# Patient Record
Sex: Female | Born: 1975 | Race: White | Hispanic: Yes | Marital: Married | Smoking: Former smoker
Health system: Southern US, Community
[De-identification: ages and names within clinical notes are randomized; demographics above are authoritative.]

## PROBLEM LIST (undated history)

## (undated) ENCOUNTER — Inpatient Hospital Stay (HOSPITAL_COMMUNITY): Payer: Self-pay

## (undated) DIAGNOSIS — R739 Hyperglycemia, unspecified: Secondary | ICD-10-CM

## (undated) DIAGNOSIS — A159 Respiratory tuberculosis unspecified: Secondary | ICD-10-CM

## (undated) DIAGNOSIS — E7800 Pure hypercholesterolemia, unspecified: Secondary | ICD-10-CM

## (undated) DIAGNOSIS — R768 Other specified abnormal immunological findings in serum: Secondary | ICD-10-CM

## (undated) DIAGNOSIS — K219 Gastro-esophageal reflux disease without esophagitis: Secondary | ICD-10-CM

## (undated) HISTORY — DX: Pure hypercholesterolemia, unspecified: E78.00

## (undated) HISTORY — PX: FOOT FRACTURE SURGERY: SHX645

## (undated) HISTORY — DX: Other specified abnormal immunological findings in serum: R76.8

---

## 2000-06-11 ENCOUNTER — Other Ambulatory Visit: Admission: RE | Admit: 2000-06-11 | Discharge: 2000-06-11 | Payer: Self-pay | Admitting: *Deleted

## 2000-09-15 ENCOUNTER — Ambulatory Visit (HOSPITAL_COMMUNITY): Admission: RE | Admit: 2000-09-15 | Discharge: 2000-09-15 | Payer: Self-pay | Admitting: Obstetrics & Gynecology

## 2000-12-17 ENCOUNTER — Ambulatory Visit (HOSPITAL_COMMUNITY): Admission: RE | Admit: 2000-12-17 | Discharge: 2000-12-17 | Payer: Self-pay | Admitting: *Deleted

## 2001-01-27 ENCOUNTER — Inpatient Hospital Stay (HOSPITAL_COMMUNITY): Admission: AD | Admit: 2001-01-27 | Discharge: 2001-01-27 | Payer: Self-pay | Admitting: *Deleted

## 2001-03-17 ENCOUNTER — Encounter (HOSPITAL_COMMUNITY): Admission: RE | Admit: 2001-03-17 | Discharge: 2001-03-19 | Payer: Self-pay | Admitting: Obstetrics & Gynecology

## 2001-03-18 ENCOUNTER — Inpatient Hospital Stay (HOSPITAL_COMMUNITY): Admission: AD | Admit: 2001-03-18 | Discharge: 2001-03-18 | Payer: Self-pay | Admitting: Obstetrics & Gynecology

## 2001-03-19 ENCOUNTER — Inpatient Hospital Stay (HOSPITAL_COMMUNITY): Admission: AD | Admit: 2001-03-19 | Discharge: 2001-03-22 | Payer: Self-pay | Admitting: *Deleted

## 2001-03-23 ENCOUNTER — Inpatient Hospital Stay (HOSPITAL_COMMUNITY): Admission: AD | Admit: 2001-03-23 | Discharge: 2001-03-23 | Payer: Self-pay | Admitting: Obstetrics

## 2002-10-14 ENCOUNTER — Emergency Department (HOSPITAL_COMMUNITY): Admission: EM | Admit: 2002-10-14 | Discharge: 2002-10-15 | Payer: Self-pay | Admitting: Emergency Medicine

## 2003-06-28 ENCOUNTER — Other Ambulatory Visit: Admission: RE | Admit: 2003-06-28 | Discharge: 2003-06-28 | Payer: Self-pay | Admitting: Gynecology

## 2004-01-23 ENCOUNTER — Inpatient Hospital Stay (HOSPITAL_COMMUNITY): Admission: AD | Admit: 2004-01-23 | Discharge: 2004-01-23 | Payer: Self-pay | Admitting: Family Medicine

## 2004-03-28 ENCOUNTER — Ambulatory Visit (HOSPITAL_COMMUNITY): Admission: RE | Admit: 2004-03-28 | Discharge: 2004-03-28 | Payer: Self-pay | Admitting: *Deleted

## 2004-08-22 ENCOUNTER — Ambulatory Visit: Payer: Self-pay | Admitting: *Deleted

## 2004-08-22 ENCOUNTER — Inpatient Hospital Stay (HOSPITAL_COMMUNITY): Admission: RE | Admit: 2004-08-22 | Discharge: 2004-08-22 | Payer: Self-pay | Admitting: *Deleted

## 2004-08-28 ENCOUNTER — Ambulatory Visit: Payer: Self-pay | Admitting: Obstetrics & Gynecology

## 2004-08-28 ENCOUNTER — Inpatient Hospital Stay (HOSPITAL_COMMUNITY): Admission: AD | Admit: 2004-08-28 | Discharge: 2004-08-31 | Payer: Self-pay | Admitting: *Deleted

## 2004-09-04 ENCOUNTER — Inpatient Hospital Stay (HOSPITAL_COMMUNITY): Admission: AD | Admit: 2004-09-04 | Discharge: 2004-09-04 | Payer: Self-pay | Admitting: Obstetrics & Gynecology

## 2004-09-04 ENCOUNTER — Encounter: Admission: RE | Admit: 2004-09-04 | Discharge: 2004-10-04 | Payer: Self-pay | Admitting: Obstetrics & Gynecology

## 2007-03-19 ENCOUNTER — Emergency Department (HOSPITAL_COMMUNITY): Admission: EM | Admit: 2007-03-19 | Discharge: 2007-03-19 | Payer: Self-pay | Admitting: Emergency Medicine

## 2008-02-06 ENCOUNTER — Inpatient Hospital Stay (HOSPITAL_COMMUNITY): Admission: AD | Admit: 2008-02-06 | Discharge: 2008-02-06 | Payer: Self-pay | Admitting: Obstetrics & Gynecology

## 2008-04-11 ENCOUNTER — Ambulatory Visit (HOSPITAL_COMMUNITY): Admission: RE | Admit: 2008-04-11 | Discharge: 2008-04-11 | Payer: Self-pay | Admitting: Obstetrics & Gynecology

## 2008-08-14 ENCOUNTER — Inpatient Hospital Stay (HOSPITAL_COMMUNITY): Admission: AD | Admit: 2008-08-14 | Discharge: 2008-08-19 | Payer: Self-pay | Admitting: Family Medicine

## 2008-08-14 ENCOUNTER — Ambulatory Visit: Payer: Self-pay | Admitting: Obstetrics & Gynecology

## 2008-08-14 ENCOUNTER — Ambulatory Visit: Payer: Self-pay | Admitting: Obstetrics and Gynecology

## 2008-08-15 ENCOUNTER — Encounter: Payer: Self-pay | Admitting: Obstetrics & Gynecology

## 2010-08-06 LAB — CBC
HCT: 26.1 % — ABNORMAL LOW (ref 36.0–46.0)
Hemoglobin: 10 g/dL — ABNORMAL LOW (ref 12.0–15.0)
Hemoglobin: 10.5 g/dL — ABNORMAL LOW (ref 12.0–15.0)
Hemoglobin: 13.4 g/dL (ref 12.0–15.0)
Hemoglobin: 9 g/dL — ABNORMAL LOW (ref 12.0–15.0)
MCHC: 34 g/dL (ref 30.0–36.0)
MCHC: 34.6 g/dL (ref 30.0–36.0)
MCV: 88.4 fL (ref 78.0–100.0)
MCV: 88.5 fL (ref 78.0–100.0)
Platelets: 164 10*3/uL (ref 150–400)
Platelets: 217 10*3/uL (ref 150–400)
RBC: 2.95 MIL/uL — ABNORMAL LOW (ref 3.87–5.11)
RBC: 3.42 MIL/uL — ABNORMAL LOW (ref 3.87–5.11)
RDW: 15.3 % (ref 11.5–15.5)
RDW: 15.6 % — ABNORMAL HIGH (ref 11.5–15.5)
WBC: 13.7 10*3/uL — ABNORMAL HIGH (ref 4.0–10.5)
WBC: 19.9 10*3/uL — ABNORMAL HIGH (ref 4.0–10.5)
WBC: 8 10*3/uL (ref 4.0–10.5)

## 2010-08-06 LAB — CREATININE, SERUM: GFR calc Af Amer: >60 mL/min (ref >60)

## 2010-09-09 NOTE — Op Note (Signed)
Tanya Cruz, Tanya Cruz    ACCOUNT NO.:  [id number]   MEDICAL RECORD NO.:  [id number]          PATIENT TYPE:  INP   LOCATION:  9132                          FACILITY:  [facility]   PHYSICIAN:  Lazaro Arms, M.D.   DATE OF BIRTH:  [birth year]   DATE OF PROCEDURE:  08/15/2008  DATE OF DISCHARGE:                               OPERATIVE REPORT   PREOPERATIVE DIAGNOSES:  1. Intrauterine pregnancy at 40-5/7th weeks gestation.  2. Previous C-section.  3. Vaginal birth attempt after cesarean section, failure.  4. Repetitive variable deceleration leaving a nonreassuring fetal      heart rate tracing.   POSTOPERATIVE DIAGNOSES:  1. Intrauterine pregnancy at 40-5/7th weeks gestation.  2. Previous C-section.  3. Vaginal birth attempt after cesarean section, failure.  4. Repetitive variable deceleration leaving a nonreassuring fetal      heart rate tracing.  5. Uterine hypotonia.   PROCEDURE:  Repeat C-section.   SURGEON:  Lazaro Arms, MD   ANESTHESIA:  Epidural.   FINDINGS:  Low-transverse hysterotomy incision, delivered a viable female  infant with Apgars of 3 and 8 with cord pH of 7.26.  We proceeded with  the C-section because the patient spent several hours despite adequate  labor and the fetal heart rate tracing was starting to worsen going from  severe variables to longer episodes of fetal heart rate depression, as a  result we proceeded with repeat C-section.   DESCRIPTION OF OPERATION:  The patient was taken to the operating room,  placed in supine position.  After her epidural was dosed up, she was  prepped and draped in usual sterile fashion.  A Pfannenstiel skin  incision was made, carried down sharply.  Rectus fascia scored in  midline, extended laterally.  Fascia taken off the muscle superiorly to  inferiorly without difficulty, the muscles were divided.  The peritoneal  cavity was entered and a bladder blade was placed.  Vesicouterine  serosal flap was created.  Low  transverse hysterotomy incision was made  over this incision, delivered a viable female infant, Apgars of 3 and 8,  cord pH 7.26.  Infant underwent routine neonatal resuscitation.  Placenta was delivered spontaneously.  Uterus was hypotonic.  She was  given Methergine, closed two layers; first being a running interlocking  layer; the second being imbricating layer of interrupted figure-of-eight  sutures.  There was good hemostasis of the uterus.  The muscles of  peritoneum reapproximated loosely.  The fascia closed using 0 Vicryl running.  After copious irrigation of  the pelvis and subcu, the skin was closed using skin staples.  The  patient tolerated the procedure well.  She experienced 1000 mL of blood  loss, taken to recovery room in good and stable condition.  All counts  correct x3.      Lazaro Arms, M.D.  Electronically Signed     LHE/MEDQ  D:  08/15/2008  T:  08/16/2008  Job:  [id number]

## 2010-09-09 NOTE — Discharge Summary (Signed)
Tanya Cruz, Tanya Cruz    ACCOUNT NO.:  [id number]   MEDICAL RECORD NO.:  [id number]          PATIENT TYPE:  INP   LOCATION:  9132                          [facility]  [facility]   PHYSICIAN:  Tilda Burrow, M.D. DATE OF BIRTH:  18-Jun-1975   DATE OF ADMISSION:  08/14/2008  DATE OF DISCHARGE:  08/19/2008                               DISCHARGE SUMMARY   REASON FOR ADMISSION:  The patient was admitted for induction of labor  due to post date.   DISCHARGE DIAGNOSIS:  Low transverse cesarean section for nonreassuring  fetal heart rate pattern by Dr. Despina Hidden.   HOSPITAL COURSE:  The patient has had some issues with nausea and  decreased bowel sounds.  The KUB on April 24, showed a totally normal  gas pattern.  The patient is feeling better today and is passing gas  rectally.   PHYSICAL EXAMINATION:  VITAL SIGNS:  Stable.  Hemoglobin is 9,  hematocrit 26, platelets were 217.  HEART:  Regular rhythm and rate.  LUNGS:  Clear to auscultation bilaterally.  ABDOMEN:  Fundus is firm.  Lochia is small amount.  Bowel sounds are  present x4.  Abdomen is soft.  Incision is intact.  There is no redness,  swelling, or drainage.  EXTREMITIES:  Trace edema in lower extremities.   ASSESSMENT:  Stable postop day #4.   DISCHARGE MEDICATIONS:  1. Chromagen Forte one p.o. b.i.d.  2. Continue her prenatal vitamin.  3. Percocet 5/325 one p.o. q.4 h. p.r.n. pain.  4. Motrin 600 q.6 h p.r.n. cramping.   PLAN:  Discharged home.  Baby Love nurse is to come out and remove  staples.  Postop day 5 through 7, she is undecided about her  contraceptive and does not wish to make a decision at this point in  time.  She is to follow up with [facility] in 6  weeks for postpartum checkup.      Zerita Boers, Lanier Clam      Tilda Burrow, M.D.  Electronically Signed    DL/MEDQ  D:  28/41/3244  T:  08/19/2008  Job:  [id number]   cc:   Lazaro Arms, M.D.  Fax: [phone]

## 2011-01-26 LAB — URINALYSIS, ROUTINE W REFLEX MICROSCOPIC
Bilirubin Urine: NEGATIVE
Ketones, ur: 15 — AB
Nitrite: NEGATIVE
Protein, ur: NEGATIVE
Urobilinogen, UA: 0.2

## 2011-01-26 LAB — GC/CHLAMYDIA PROBE AMP, GENITAL
Chlamydia, DNA Probe: NEGATIVE
GC Probe Amp, Genital: NEGATIVE

## 2011-01-26 LAB — WET PREP, GENITAL

## 2011-01-26 LAB — CBC
Hemoglobin: 12.5
Platelets: 266
RDW: 13.9

## 2011-06-02 ENCOUNTER — Ambulatory Visit: Payer: Self-pay | Admitting: Family Medicine

## 2011-06-02 VITALS — BP 114/76 | HR 68 | Temp 98.5°F | Resp 16 | Ht 59.0 in | Wt 160.0 lb

## 2011-06-02 DIAGNOSIS — L259 Unspecified contact dermatitis, unspecified cause: Secondary | ICD-10-CM

## 2011-06-02 MED ORDER — TRIAMCINOLONE ACETONIDE 0.1 % EX CREA: TOPICAL_CREAM | Freq: Two times a day (BID) | CUTANEOUS | Status: DC

## 2011-06-02 MED ORDER — PREDNISONE 20 MG PO TABS: ORAL_TABLET | ORAL | Status: AC

## 2011-06-02 NOTE — Progress Notes (Signed)
  Subjective:    Patient ID: Tanya Cruz, female    DOB: [birth year], 36 y.o.   MRN: [id number]  HPI 36 yo female, healthy, presents with: rash around neck for 4 weeks.  Started with just a few spots and now larger, looks like sunburn.  Itches.  She did wear a new necklace prior to symptoms starting.  Only wore it that once.  Had reacted to earrings before.  Tried "aluminum cream" - no change to rash.  Did help itch.     Review of Systems Negative except as per HPI     Objective:   Physical Exam  Neck:     Obese, alert, normal wob Erythematous maculopapular rash in semicircle around anterior neck and chest.         Assessment & Plan:  Rash - suspect contact dermatitis, likely to metal in new necklace.  TAC cream and pred taper.  RTC if symptoms do not improve.

## 2011-11-25 ENCOUNTER — Other Ambulatory Visit: Payer: Self-pay

## 2011-11-25 ENCOUNTER — Other Ambulatory Visit (HOSPITAL_COMMUNITY): Payer: Self-pay | Admitting: Physician Assistant

## 2011-11-25 DIAGNOSIS — Z0489 Encounter for examination and observation for other specified reasons: Secondary | ICD-10-CM

## 2011-11-25 DIAGNOSIS — IMO0002 Reserved for concepts with insufficient information to code with codable children: Secondary | ICD-10-CM

## 2011-11-25 LAB — OB RESULTS CONSOLE HIV ANTIBODY (ROUTINE TESTING): HIV: NONREACTIVE

## 2011-11-25 LAB — OB RESULTS CONSOLE ABO/RH

## 2011-11-25 LAB — OB RESULTS CONSOLE RUBELLA ANTIBODY, IGM: Rubella: IMMUNE

## 2011-11-25 LAB — OB RESULTS CONSOLE RPR: RPR: NONREACTIVE

## 2011-11-30 ENCOUNTER — Encounter (HOSPITAL_COMMUNITY): Payer: Self-pay

## 2011-11-30 ENCOUNTER — Ambulatory Visit (HOSPITAL_COMMUNITY)
Admission: RE | Admit: 2011-11-30 | Discharge: 2011-11-30 | Disposition: A | Discharge status: Home / Self Care | Payer: Self-pay | Source: Ambulatory Visit | Attending: Physician Assistant | Admitting: Physician Assistant

## 2011-11-30 DIAGNOSIS — O34219 Maternal care for unspecified type scar from previous cesarean delivery: Secondary | ICD-10-CM | POA: Insufficient documentation

## 2011-11-30 DIAGNOSIS — Z0489 Encounter for examination and observation for other specified reasons: Secondary | ICD-10-CM

## 2011-11-30 DIAGNOSIS — IMO0002 Reserved for concepts with insufficient information to code with codable children: Secondary | ICD-10-CM

## 2011-11-30 DIAGNOSIS — O358XX0 Maternal care for other (suspected) fetal abnormality and damage, not applicable or unspecified: Secondary | ICD-10-CM | POA: Insufficient documentation

## 2011-11-30 DIAGNOSIS — Z1389 Encounter for screening for other disorder: Secondary | ICD-10-CM | POA: Insufficient documentation

## 2011-11-30 DIAGNOSIS — Z363 Encounter for antenatal screening for malformations: Secondary | ICD-10-CM | POA: Insufficient documentation

## 2011-12-01 ENCOUNTER — Encounter (HOSPITAL_COMMUNITY): Payer: Self-pay

## 2011-12-01 ENCOUNTER — Ambulatory Visit (HOSPITAL_COMMUNITY)
Admission: RE | Admit: 2011-12-01 | Discharge: 2011-12-01 | Disposition: A | Discharge status: Home / Self Care | Payer: Self-pay | Source: Ambulatory Visit | Attending: Physician Assistant | Admitting: Physician Assistant

## 2011-12-01 NOTE — Progress Notes (Signed)
Genetic Counseling  High-Risk Gestation Note  Appointment Date:  12/01/2011 Referred By: Tanya Gal, PA Date of Birth:  1976/01/16    Pregnancy History: G4P3 Estimated Date of Delivery: 04/10/12 Estimated Gestational Age: [redacted]w[redacted]d Attending: Rema Fendt, MD   Ms. Tanya Cruz, and her partner, Mr. Tanya Cruz, were seen for genetic counseling regarding a maternal age of 36 y.o.Marland Kitchen  They were counseled regarding maternal age and the association with risk for chromosome conditions due to nondisjunction with aging of the ova.   We reviewed chromosomes, nondisjunction, and the associated 1 in 37 risk for fetal aneuploidy related to a maternal age of 36 y.o. at [redacted]w[redacted]d weeks gestation.  They were counseled that the risk for aneuploidy decreases as gestational age increases, accounting for those pregnancies which spontaneously abort.  We specifically discussed Down syndrome (trisomy 13), trisomies 63 and 75, and sex chromosome aneuploidies (47,XXX and 47,XXY) including the common features and prognoses of each.   We also reviewed Ms. Tanya Cruz's maternal serum Quad screen result and the associated reduction in risks for fetal Down syndrome (1 in 175 to 1 in 3,445), trisomy 18 (1 in 526 to less than 1 in 10,000), and ONTDs.  They understand that Quad screening provides a pregnancy specific risk for these conditions, but is not considered to be diagnostic.  They were counseled regarding other available screening and diagnostic options including ultrasound and amniocentesis.  We reviewed that ultrasound performed on 11/30/11 at [facility] [place] reported normal visualized fetal anatomy with no visualized markers of fetal aneuploidy. The risks, benefits, and limitations of each of these options were reviewed in detail.  After thoughtful consideration of these options, they declined additional screening or testing for aneuploidy, including amnicoentesis. She stated that  she did not feel that it would be beneficial to know about the presence of a chromosome condition in the pregnancy prior to delivery.  They understand that screening tests cannot rule out all birth defects or genetic syndromes.  The patient was advised of this limitation and states she still does not want diagnostic testing at this time.  However, they were counseled that 50-80% of fetuses with Down syndrome and up to 90% of fetuses with trisomies 13 and 18, when well visualized, have detectable anomalies or soft markers by ultrasound.   Ms. Tanya Cruz was provided with written information regarding sickle cell anemia (SCA) including the carrier frequency and incidence in the Hispanic population, the availability of carrier testing and prenatal diagnosis if indicated.  In addition, we discussed that hemoglobinopathies are routinely screened for as part of the [place] newborn screening panel.  She declined hemoglobin electrophoresis today.   Both family histories were reviewed and found to be contributory for a niece of the father of the baby with open spina bifida. She had surgical correction after birth, and is currently 36 years old and healthy. She reportedly does not require assistance walking. We discussed that spina bifida is a common birth differences and affects approximately 1 in 1000 live births.  NTDs occur as an isolated finding, in the majority of cases. Isolated NTDs are usually inherited in a multifactorial manner in which there is no prior family history.  Multifactorial conditions have both environmental and genetic factors that contribute to their development.  Both the genetic and environmental factors that contribute to the development of spina bifida are largely unknown; however, some medications and health conditions, such as uncontrolled diabetes and obesity, may increase the chance of spina bifida.  We also discussed that NTDs may occur as a feature of an underlying genetic  syndrome or condition.  Approximately 5-10% of individuals who have spina bifida also have an underlying chromosome condition.  Without additional information, it is difficult to determine a specific etiology.  If the child had a nonsyndromic, multifactorial NTD, the risk of recurrence is estimated to be 0.5-1% for a third degree relative.  If however, the child had an underlying genetic syndrome, the risk of recurrence depends upon the inheritance of the condition.  Further genetic counseling is warranted if more information is obtained. We reviewed MSAFP and ultrasound screening for NTDs.  We reviewed that detailed ultrasound detects between 80 and 90% of NTDs. The family history was otherwise unremarkable for birth defects, mental retardation, recurrent pregnancy loss, or known genetic conditions. Without further information regarding the provided family history, an accurate genetic risk cannot be calculated. Further genetic counseling is warranted if more information is obtained.  Ms. Tanya Cruz denied exposure to environmental toxins or chemical agents. She denied the use of alcohol, tobacco or street drugs. She denied significant viral illnesses during the course of her pregnancy. Her medical and surgical histories were noncontributory.    I counseled this couple regarding the above risks and available options.  The approximate face-to-face time with the genetic counselor was 40 minutes.  Tanya Plowman, MS Certified Genetic Counselor 12/01/2011

## 2012-03-21 ENCOUNTER — Encounter (HOSPITAL_COMMUNITY): Payer: Self-pay | Admitting: Pharmacist

## 2012-04-01 ENCOUNTER — Encounter (HOSPITAL_COMMUNITY): Payer: Self-pay

## 2012-04-01 ENCOUNTER — Encounter (HOSPITAL_COMMUNITY)
Admission: RE | Admit: 2012-04-01 | Discharge: 2012-04-01 | Disposition: A | Discharge status: Home / Self Care | Payer: Medicaid Other | Source: Ambulatory Visit | Attending: Obstetrics & Gynecology | Admitting: Obstetrics & Gynecology

## 2012-04-01 HISTORY — DX: Gastro-esophageal reflux disease without esophagitis: K21.9

## 2012-04-01 LAB — CBC
HCT: 40.4 % (ref 36.0–46.0)
Hemoglobin: 13.6 g/dL (ref 12.0–15.0)
MCV: 86.1 fL (ref 78.0–100.0)
RDW: 15.1 % (ref 11.5–15.5)
WBC: 7.9 10*3/uL (ref 4.0–10.5)

## 2012-04-01 LAB — TYPE AND SCREEN
ABO/RH(D): O POS
Antibody Screen: NEGATIVE

## 2012-04-01 NOTE — Pre-Procedure Instructions (Signed)
Health history received with assistance of hospital Spanish interpreter [facility]

## 2012-04-01 NOTE — Patient Instructions (Addendum)
   Your procedure is scheduled on: Monday December 9th  Enter through the Main Entrance of [facility] at:8am Pick up the phone at the desk and dial [phone] and inform us of your arrival.  Please call this number if you have any problems the morning of surgery: [phone]  Remember: Do not eat or drink anything after midnight on Sunday Please take your pepcid with sips of water morning of surgery with sips of water   Do not wear jewelry, make-up, or FINGER nail polish No metal in your hair or on your body. Do not wear lotions, powders, perfumes. You may wear deodorant.  Please use your CHG wash as directed prior to surgery.  Do not shave anywhere for at least 12 hours prior to first CHG shower.  Do not bring valuables to the hospital.   Leave suitcase in the car. After Surgery it may be brought to your room. For patients being admitted to the hospital, checkout time is 11:00am the day of discharge.

## 2012-04-03 MED ORDER — DEXTROSE 5 % IV SOLN
2.0000 g | INTRAVENOUS | Status: AC
  Administered 2012-04-04: 2 g via INTRAVENOUS
  Filled 2012-04-03: qty 2

## 2012-04-04 ENCOUNTER — Encounter (HOSPITAL_COMMUNITY): Payer: Self-pay | Admitting: *Deleted

## 2012-04-04 ENCOUNTER — Inpatient Hospital Stay (HOSPITAL_COMMUNITY): Payer: Medicaid Other | Admitting: Anesthesiology

## 2012-04-04 ENCOUNTER — Encounter (HOSPITAL_COMMUNITY): Payer: Self-pay | Admitting: Anesthesiology

## 2012-04-04 ENCOUNTER — Inpatient Hospital Stay (HOSPITAL_COMMUNITY)
Admission: AD | Admit: 2012-04-04 | Discharge: 2012-04-04 | Disposition: A | Discharge status: Home / Self Care | Payer: Self-pay | Attending: Obstetrics & Gynecology | Admitting: Obstetrics & Gynecology

## 2012-04-04 ENCOUNTER — Encounter (HOSPITAL_COMMUNITY)
Admission: AD | Disposition: A | Discharge status: Home / Self Care | Payer: Self-pay | Source: Ambulatory Visit | Attending: Obstetrics & Gynecology

## 2012-04-04 ENCOUNTER — Inpatient Hospital Stay (HOSPITAL_COMMUNITY)
Admission: AD | Admit: 2012-04-04 | Discharge: 2012-04-07 | DRG: 766 | Disposition: A | Discharge status: Home / Self Care | Payer: Medicaid Other | Source: Ambulatory Visit | Attending: Obstetrics & Gynecology | Admitting: Obstetrics & Gynecology

## 2012-04-04 DIAGNOSIS — O0930 Supervision of pregnancy with insufficient antenatal care, unspecified trimester: Secondary | ICD-10-CM

## 2012-04-04 DIAGNOSIS — O479 False labor, unspecified: Secondary | ICD-10-CM | POA: Insufficient documentation

## 2012-04-04 DIAGNOSIS — R21 Rash and other nonspecific skin eruption: Secondary | ICD-10-CM

## 2012-04-04 DIAGNOSIS — O34219 Maternal care for unspecified type scar from previous cesarean delivery: Secondary | ICD-10-CM

## 2012-04-04 DIAGNOSIS — O09529 Supervision of elderly multigravida, unspecified trimester: Secondary | ICD-10-CM | POA: Diagnosis present

## 2012-04-04 DIAGNOSIS — IMO0002 Reserved for concepts with insufficient information to code with codable children: Secondary | ICD-10-CM | POA: Diagnosis present

## 2012-04-04 DIAGNOSIS — O9989 Other specified diseases and conditions complicating pregnancy, childbirth and the puerperium: Secondary | ICD-10-CM

## 2012-04-04 DIAGNOSIS — O99893 Other specified diseases and conditions complicating puerperium: Secondary | ICD-10-CM

## 2012-04-04 DIAGNOSIS — Z603 Acculturation difficulty: Secondary | ICD-10-CM

## 2012-04-04 SURGERY — Surgical Case
Anesthesia: Epidural | Site: Abdomen | Wound class: Clean Contaminated

## 2012-04-04 MED ORDER — BUPIVACAINE IN DEXTROSE 0.75-8.25 % IT SOLN
INTRATHECAL | Status: DC | PRN
  Administered 2012-04-04: 1.5 mL via INTRATHECAL

## 2012-04-04 MED ORDER — MEPERIDINE HCL 25 MG/ML IJ SOLN: 6.2500 mg | INTRAMUSCULAR | Status: DC | PRN

## 2012-04-04 MED ORDER — NALOXONE HCL 0.4 MG/ML IJ SOLN: 0.4000 mg | INTRAMUSCULAR | Status: DC | PRN

## 2012-04-04 MED ORDER — KETOROLAC TROMETHAMINE 60 MG/2ML IM SOLN
INTRAMUSCULAR | Status: AC
  Administered 2012-04-04: 60 mg via INTRAMUSCULAR
  Filled 2012-04-04: qty 2

## 2012-04-04 MED ORDER — NALBUPHINE SYRINGE 5 MG/0.5 ML
INJECTION | INTRAMUSCULAR | Status: AC
  Administered 2012-04-04: 10 mg via SUBCUTANEOUS
  Filled 2012-04-04: qty 1

## 2012-04-04 MED ORDER — OXYTOCIN 40 UNITS IN LACTATED RINGERS INFUSION - SIMPLE MED: 62.5000 mL/h | INTRAVENOUS | Status: AC

## 2012-04-04 MED ORDER — IBUPROFEN 600 MG PO TABS
600.0000 mg | ORAL_TABLET | Freq: Four times a day (QID) | ORAL | Status: DC | PRN
  Filled 2012-04-04 (×5): qty 1

## 2012-04-04 MED ORDER — ACETAMINOPHEN 10 MG/ML IV SOLN
1000.0000 mg | Freq: Four times a day (QID) | INTRAVENOUS | Status: AC | PRN
  Administered 2012-04-04: 1000 mg via INTRAVENOUS
  Filled 2012-04-04 (×2): qty 100

## 2012-04-04 MED ORDER — DIPHENHYDRAMINE HCL 50 MG/ML IJ SOLN: 12.5000 mg | INTRAMUSCULAR | Status: DC | PRN

## 2012-04-04 MED ORDER — NALBUPHINE HCL 10 MG/ML IJ SOLN
5.0000 mg | INTRAMUSCULAR | Status: DC | PRN
  Administered 2012-04-04: 10 mg via SUBCUTANEOUS
  Filled 2012-04-04: qty 1

## 2012-04-04 MED ORDER — SCOPOLAMINE 1 MG/3DAYS TD PT72
1.0000 | MEDICATED_PATCH | Freq: Once | TRANSDERMAL | Status: DC
  Filled 2012-04-04: qty 1

## 2012-04-04 MED ORDER — SENNOSIDES-DOCUSATE SODIUM 8.6-50 MG PO TABS
2.0000 | ORAL_TABLET | Freq: Every day | ORAL | Status: DC
  Administered 2012-04-04 – 2012-04-06 (×3): 2 via ORAL

## 2012-04-04 MED ORDER — MIDAZOLAM HCL 2 MG/2ML IJ SOLN: 0.5000 mg | Freq: Once | INTRAMUSCULAR | Status: AC | PRN

## 2012-04-04 MED ORDER — SODIUM CHLORIDE 0.9 % IJ SOLN: 3.0000 mL | Freq: Two times a day (BID) | INTRAMUSCULAR | Status: DC

## 2012-04-04 MED ORDER — ONDANSETRON HCL 4 MG PO TABS: 4.0000 mg | ORAL_TABLET | ORAL | Status: DC | PRN

## 2012-04-04 MED ORDER — DIBUCAINE 1 % RE OINT: 1.0000 "application " | TOPICAL_OINTMENT | RECTAL | Status: DC | PRN

## 2012-04-04 MED ORDER — KETOROLAC TROMETHAMINE 30 MG/ML IJ SOLN
30.0000 mg | Freq: Four times a day (QID) | INTRAMUSCULAR | Status: AC | PRN
  Administered 2012-04-04: 30 mg via INTRAVENOUS
  Filled 2012-04-04: qty 1

## 2012-04-04 MED ORDER — NALOXONE HCL 1 MG/ML IJ SOLN
1.0000 ug/kg/h | INTRAVENOUS | Status: DC | PRN
  Filled 2012-04-04: qty 2

## 2012-04-04 MED ORDER — CEFAZOLIN SODIUM-DEXTROSE 2-3 GM-% IV SOLR
INTRAVENOUS | Status: AC
  Filled 2012-04-04: qty 50

## 2012-04-04 MED ORDER — OXYCODONE-ACETAMINOPHEN 5-325 MG PO TABS
1.0000 | ORAL_TABLET | ORAL | Status: DC | PRN
  Administered 2012-04-05: 1 via ORAL
  Administered 2012-04-05 (×2): 2 via ORAL
  Administered 2012-04-05: 1 via ORAL
  Administered 2012-04-05: 2 via ORAL
  Administered 2012-04-05: 1 via ORAL
  Administered 2012-04-06: 2 via ORAL
  Filled 2012-04-04 (×2): qty 2
  Filled 2012-04-04 (×3): qty 1
  Filled 2012-04-04 (×2): qty 2

## 2012-04-04 MED ORDER — SODIUM CHLORIDE 0.9 % IV SOLN
250.0000 mL | INTRAVENOUS | Status: DC
  Administered 2012-04-04: 1000 mL via INTRAVENOUS

## 2012-04-04 MED ORDER — DIPHENHYDRAMINE HCL 25 MG PO CAPS
25.0000 mg | ORAL_CAPSULE | ORAL | Status: DC | PRN
  Administered 2012-04-05: 25 mg via ORAL
  Filled 2012-04-04: qty 1

## 2012-04-04 MED ORDER — LACTATED RINGERS IV SOLN
INTRAVENOUS | Status: DC
  Administered 2012-04-04 (×3): via INTRAVENOUS

## 2012-04-04 MED ORDER — MAGNESIUM HYDROXIDE 400 MG/5ML PO SUSP: 30.0000 mL | ORAL | Status: DC | PRN

## 2012-04-04 MED ORDER — NALBUPHINE HCL 10 MG/ML IJ SOLN
5.0000 mg | INTRAMUSCULAR | Status: DC | PRN
  Administered 2012-04-04: 10 mg via INTRAVENOUS
  Filled 2012-04-04: qty 1

## 2012-04-04 MED ORDER — WITCH HAZEL-GLYCERIN EX PADS: 1.0000 "application " | MEDICATED_PAD | CUTANEOUS | Status: DC | PRN

## 2012-04-04 MED ORDER — DIPHENHYDRAMINE HCL 25 MG PO CAPS: 25.0000 mg | ORAL_CAPSULE | Freq: Four times a day (QID) | ORAL | Status: DC | PRN

## 2012-04-04 MED ORDER — PROMETHAZINE HCL 25 MG/ML IJ SOLN: 6.2500 mg | INTRAMUSCULAR | Status: DC | PRN

## 2012-04-04 MED ORDER — MENTHOL 3 MG MT LOZG
1.0000 | LOZENGE | OROMUCOSAL | Status: DC | PRN
  Administered 2012-04-04 – 2012-04-06 (×2): 3 mg via ORAL
  Filled 2012-04-04 (×2): qty 9

## 2012-04-04 MED ORDER — PHENYLEPHRINE HCL 10 MG/ML IJ SOLN
INTRAMUSCULAR | Status: DC | PRN
  Administered 2012-04-04: 80 ug via INTRAVENOUS
  Administered 2012-04-04: 120 ug via INTRAVENOUS
  Administered 2012-04-04 (×2): 80 ug via INTRAVENOUS
  Administered 2012-04-04: 40 ug via INTRAVENOUS
  Administered 2012-04-04: 80 ug via INTRAVENOUS
  Administered 2012-04-04: 40 ug via INTRAVENOUS
  Administered 2012-04-04 (×3): 80 ug via INTRAVENOUS
  Administered 2012-04-04: 40 ug via INTRAVENOUS
  Administered 2012-04-04: 80 ug via INTRAVENOUS

## 2012-04-04 MED ORDER — IBUPROFEN 600 MG PO TABS
600.0000 mg | ORAL_TABLET | Freq: Four times a day (QID) | ORAL | Status: DC
  Administered 2012-04-04 – 2012-04-07 (×11): 600 mg via ORAL
  Filled 2012-04-04 (×6): qty 1

## 2012-04-04 MED ORDER — HYDROMORPHONE HCL PF 1 MG/ML IJ SOLN: 0.2500 mg | INTRAMUSCULAR | Status: DC | PRN

## 2012-04-04 MED ORDER — NALBUPHINE HCL 10 MG/ML IJ SOLN
5.0000 mg | INTRAMUSCULAR | Status: DC | PRN
  Filled 2012-04-04: qty 1

## 2012-04-04 MED ORDER — MORPHINE SULFATE (PF) 0.5 MG/ML IJ SOLN
INTRAMUSCULAR | Status: DC | PRN
  Administered 2012-04-04: .1 mg via INTRATHECAL

## 2012-04-04 MED ORDER — ONDANSETRON HCL 4 MG/2ML IJ SOLN
INTRAMUSCULAR | Status: DC | PRN
  Administered 2012-04-04: 4 mg via INTRAVENOUS

## 2012-04-04 MED ORDER — PRENATAL MULTIVITAMIN CH
1.0000 | ORAL_TABLET | Freq: Every day | ORAL | Status: DC
  Administered 2012-04-05 – 2012-04-07 (×3): 1 via ORAL
  Filled 2012-04-04 (×3): qty 1

## 2012-04-04 MED ORDER — ONDANSETRON HCL 4 MG/2ML IJ SOLN: 4.0000 mg | INTRAMUSCULAR | Status: DC | PRN

## 2012-04-04 MED ORDER — FENTANYL CITRATE 0.05 MG/ML IJ SOLN
INTRAMUSCULAR | Status: DC | PRN
  Administered 2012-04-04: 25 ug via INTRATHECAL

## 2012-04-04 MED ORDER — NALBUPHINE HCL 10 MG/ML IJ SOLN
5.0000 mg | INTRAMUSCULAR | Status: DC | PRN
  Filled 2012-04-04 (×2): qty 1

## 2012-04-04 MED ORDER — NALOXONE HCL 1 MG/ML IJ SOLN: 1.0000 ug/kg/h | INTRAVENOUS | Status: DC | PRN

## 2012-04-04 MED ORDER — KETOROLAC TROMETHAMINE 30 MG/ML IJ SOLN: 30.0000 mg | Freq: Four times a day (QID) | INTRAMUSCULAR | Status: AC | PRN

## 2012-04-04 MED ORDER — SODIUM CHLORIDE 0.9 % IJ SOLN: 3.0000 mL | INTRAMUSCULAR | Status: DC | PRN

## 2012-04-04 MED ORDER — KETOROLAC TROMETHAMINE 60 MG/2ML IM SOLN
60.0000 mg | Freq: Once | INTRAMUSCULAR | Status: AC | PRN
  Administered 2012-04-04: 60 mg via INTRAMUSCULAR

## 2012-04-04 MED ORDER — EPHEDRINE SULFATE 50 MG/ML IJ SOLN
INTRAMUSCULAR | Status: DC | PRN
  Administered 2012-04-04: 10 mg via INTRAVENOUS
  Administered 2012-04-04: 5 mg via INTRAVENOUS

## 2012-04-04 MED ORDER — FENTANYL CITRATE 0.05 MG/ML IJ SOLN: 25.0000 ug | INTRAMUSCULAR | Status: DC | PRN

## 2012-04-04 MED ORDER — SCOPOLAMINE 1 MG/3DAYS TD PT72
1.0000 | MEDICATED_PATCH | Freq: Once | TRANSDERMAL | Status: DC
  Administered 2012-04-04: 1.5 mg via TRANSDERMAL

## 2012-04-04 MED ORDER — ZOLPIDEM TARTRATE 5 MG PO TABS: 5.0000 mg | ORAL_TABLET | Freq: Every evening | ORAL | Status: DC | PRN

## 2012-04-04 MED ORDER — TETANUS-DIPHTH-ACELL PERTUSSIS 5-2.5-18.5 LF-MCG/0.5 IM SUSP
0.5000 mL | Freq: Once | INTRAMUSCULAR | Status: AC
  Administered 2012-04-05: 0.5 mL via INTRAMUSCULAR
  Filled 2012-04-04: qty 0.5

## 2012-04-04 MED ORDER — OXYTOCIN 10 UNIT/ML IJ SOLN
40.0000 [IU] | INTRAVENOUS | Status: DC | PRN
  Administered 2012-04-04: 40 [IU] via INTRAVENOUS

## 2012-04-04 MED ORDER — KETOROLAC TROMETHAMINE 30 MG/ML IJ SOLN: 30.0000 mg | Freq: Four times a day (QID) | INTRAMUSCULAR | Status: DC | PRN

## 2012-04-04 MED ORDER — ONDANSETRON HCL 4 MG/2ML IJ SOLN
INTRAMUSCULAR | Status: AC
  Filled 2012-04-04: qty 2

## 2012-04-04 MED ORDER — BUPIVACAINE HCL (PF) 0.5 % IJ SOLN
INTRAMUSCULAR | Status: AC
  Filled 2012-04-04: qty 30

## 2012-04-04 MED ORDER — BUPIVACAINE HCL (PF) 0.5 % IJ SOLN
INTRAMUSCULAR | Status: DC | PRN
  Administered 2012-04-04: 30 mL

## 2012-04-04 MED ORDER — ONDANSETRON HCL 4 MG/2ML IJ SOLN: 4.0000 mg | Freq: Three times a day (TID) | INTRAMUSCULAR | Status: DC | PRN

## 2012-04-04 MED ORDER — METOCLOPRAMIDE HCL 5 MG/ML IJ SOLN: 10.0000 mg | Freq: Three times a day (TID) | INTRAMUSCULAR | Status: DC | PRN

## 2012-04-04 MED ORDER — DIPHENHYDRAMINE HCL 25 MG PO CAPS: 25.0000 mg | ORAL_CAPSULE | ORAL | Status: DC | PRN

## 2012-04-04 MED ORDER — SIMETHICONE 80 MG PO CHEW
80.0000 mg | CHEWABLE_TABLET | ORAL | Status: DC | PRN
  Administered 2012-04-04: 80 mg via ORAL

## 2012-04-04 MED ORDER — GLYCOPYRROLATE 0.2 MG/ML IJ SOLN
INTRAMUSCULAR | Status: AC
  Filled 2012-04-04: qty 4

## 2012-04-04 MED ORDER — PHENYLEPHRINE 40 MCG/ML (10ML) SYRINGE FOR IV PUSH (FOR BLOOD PRESSURE SUPPORT)
PREFILLED_SYRINGE | INTRAVENOUS | Status: AC
  Filled 2012-04-04: qty 10

## 2012-04-04 MED ORDER — LACTATED RINGERS IV SOLN
INTRAVENOUS | Status: DC | PRN
  Administered 2012-04-04: 10:00:00 via INTRAVENOUS

## 2012-04-04 MED ORDER — LANOLIN HYDROUS EX OINT: 1.0000 "application " | TOPICAL_OINTMENT | CUTANEOUS | Status: DC | PRN

## 2012-04-04 MED ORDER — DIPHENHYDRAMINE HCL 50 MG/ML IJ SOLN: 25.0000 mg | INTRAMUSCULAR | Status: DC | PRN

## 2012-04-04 MED ORDER — FENTANYL CITRATE 0.05 MG/ML IJ SOLN
INTRAMUSCULAR | Status: AC
  Filled 2012-04-04: qty 2

## 2012-04-04 MED ORDER — MORPHINE SULFATE 0.5 MG/ML IJ SOLN
INTRAMUSCULAR | Status: AC
  Filled 2012-04-04: qty 10

## 2012-04-04 SURGICAL SUPPLY — 39 items
APL SKNCLS STERI-STRIP NONHPOA (GAUZE/BANDAGES/DRESSINGS) ×1
BENZOIN TINCTURE PRP APPL 2/3 (GAUZE/BANDAGES/DRESSINGS) ×1 IMPLANT
CLOTH BEACON ORANGE TIMEOUT ST (SAFETY) ×2 IMPLANT
DRESSING TELFA 8X3 (GAUZE/BANDAGES/DRESSINGS) ×2 IMPLANT
DRSG OPSITE 6X11 MED (GAUZE/BANDAGES/DRESSINGS) ×1 IMPLANT
DRSG OPSITE POSTOP 4X10 (GAUZE/BANDAGES/DRESSINGS) IMPLANT
DURAPREP 26ML APPLICATOR (WOUND CARE) ×2 IMPLANT
ELECT REM PT RETURN 9FT ADLT (ELECTROSURGICAL) ×2
ELECTRODE REM PT RTRN 9FT ADLT (ELECTROSURGICAL) ×1 IMPLANT
EXTRACTOR VACUUM M CUP 4 TUBE (SUCTIONS) IMPLANT
GAUZE SPONGE 4X4 12PLY STRL LF (GAUZE/BANDAGES/DRESSINGS) ×4 IMPLANT
GLOVE BIO SURGEON STRL SZ7 (GLOVE) ×2 IMPLANT
GLOVE BIOGEL PI IND STRL 7.0 (GLOVE) ×2 IMPLANT
GLOVE BIOGEL PI INDICATOR 7.0 (GLOVE) ×2
GOWN PREVENTION PLUS LG XLONG (DISPOSABLE) ×6 IMPLANT
HEMOSTAT SURGICEL 4X8 (HEMOSTASIS) ×2 IMPLANT
KIT ABG SYR 3ML LUER SLIP (SYRINGE) IMPLANT
NEEDLE HYPO 22GX1.5 SAFETY (NEEDLE) ×2 IMPLANT
NEEDLE HYPO 25X5/8 SAFETYGLIDE (NEEDLE) ×2 IMPLANT
NS IRRIG 1000ML POUR BTL (IV SOLUTION) ×2 IMPLANT
PACK C SECTION WH (CUSTOM PROCEDURE TRAY) ×2 IMPLANT
PAD ABD 7.5X8 STRL (GAUZE/BANDAGES/DRESSINGS) IMPLANT
PAD OB MATERNITY 4.3X12.25 (PERSONAL CARE ITEMS) IMPLANT
RTRCTR C-SECT PINK 25CM LRG (MISCELLANEOUS) ×1 IMPLANT
SLEEVE SCD COMPRESS KNEE LRG (MISCELLANEOUS) IMPLANT
SLEEVE SCD COMPRESS KNEE MED (MISCELLANEOUS) IMPLANT
STAPLER VISISTAT 35W (STAPLE) IMPLANT
STRIP CLOSURE SKIN 1/2X4 (GAUZE/BANDAGES/DRESSINGS) ×1 IMPLANT
SUT PDS AB 0 CT1 27 (SUTURE) ×4 IMPLANT
SUT PDS AB 0 CTX 36 PDP370T (SUTURE) IMPLANT
SUT PLAIN 2 0 XLH (SUTURE) ×2 IMPLANT
SUT VIC AB 0 CT1 36 (SUTURE) ×4 IMPLANT
SUT VIC AB 0 CTX 36 (SUTURE) ×4
SUT VIC AB 0 CTX36XBRD ANBCTRL (SUTURE) ×2 IMPLANT
SUT VIC AB 4-0 KS 27 (SUTURE) ×2 IMPLANT
SYR CONTROL 10ML LL (SYRINGE) ×2 IMPLANT
TOWEL OR 17X24 6PK STRL BLUE (TOWEL DISPOSABLE) ×4 IMPLANT
TRAY FOLEY CATH 14FR (SET/KITS/TRAYS/PACK) ×2 IMPLANT
WATER STERILE IRR 1000ML POUR (IV SOLUTION) ×2 IMPLANT

## 2012-04-04 NOTE — Addendum Note (Signed)
Addendum  created 04/04/12 1629 by Gessica Jawad M Dimple Bastyr, RN   Modules edited:Notes Section    

## 2012-04-04 NOTE — Anesthesia Preprocedure Evaluation (Signed)
Anesthesia Evaluation  Patient identified by MRN, date of birth, ID band Patient awake    Reviewed: Allergy & Precautions, H&P , Patient's Chart, lab work & pertinent test results  Airway Mallampati: II TM Distance: >3 FB Neck ROM: full    Dental No notable dental hx.    Pulmonary neg pulmonary ROS,  breath sounds clear to auscultation  Pulmonary exam normal       Cardiovascular negative cardio ROS  Rhythm:regular Rate:Normal     Neuro/Psych negative neurological ROS  negative psych ROS   GI/Hepatic negative GI ROS, Neg liver ROS, GERD-  ,  Endo/Other  negative endocrine ROS  Renal/GU negative Renal ROS     Musculoskeletal   Abdominal   Peds  Hematology negative hematology ROS (+)   Anesthesia Other Findings   Reproductive/Obstetrics (+) Pregnancy                           Anesthesia Physical Anesthesia Plan  ASA: II  Anesthesia Plan: Epidural   Post-op Pain Management:    Induction:   Airway Management Planned:   Additional Equipment:   Intra-op Plan:   Post-operative Plan:   Informed Consent: I have reviewed the patients History and Physical, chart, labs and discussed the procedure including the risks, benefits and alternatives for the proposed anesthesia with the patient or authorized representative who has indicated his/her understanding and acceptance.     Plan Discussed with:   Anesthesia Plan Comments:         Anesthesia Quick Evaluation  

## 2012-04-04 NOTE — Transfer of Care (Signed)
Immediate Anesthesia Transfer of Care Note  Patient: Tanya Cruz  Procedure(s) Performed: Procedure(s) (LRB) with comments: CESAREAN SECTION (N/A)  Patient Location: PACU  Anesthesia Type:Spinal  Level of Consciousness: awake, alert  and oriented  Airway & Oxygen Therapy: Patient Spontanous Breathing  Post-op Assessment: Report given to PACU RN  Post vital signs: Reviewed and stable  Complications: No apparent anesthesia complications

## 2012-04-04 NOTE — Op Note (Signed)
[place] [facility] PROCEDURE DATE: 04/04/2012  PREOPERATIVE DIAGNOSIS: Intrauterine pregnancy at  [redacted]w[redacted]d weeks gestation; previous cesarean section x 2  POSTOPERATIVE DIAGNOSIS: The same  PROCEDURE: Repeat Low Transverse Cesarean Section  SURGEON:  Dr. Jaynie Collins  ASSISTANT:  Dr. Napoleon Form  ANESTHESIOLOGIST: Dr. Brayton Caves  INDICATIONS: Tanya Cruz is a 36 y.o. G4P1003 at [redacted]w[redacted]d here for repeat cesarean section secondary to the indications listed under preoperative diagnosis; please see preoperative note for further details.  The risks of cesarean section were discussed with the patient including but were not limited to: bleeding which may require transfusion or reoperation; infection which may require antibiotics; injury to bowel, bladder, ureters or other surrounding organs; injury to the fetus; need for additional procedures including hysterectomy in the event of a life-threatening hemorrhage; placental abnormalities wth subsequent pregnancies, incisional problems, thromboembolic phenomenon and other postoperative/anesthesia complications.   The patient concurred with the proposed plan, giving informed written consent for the procedure.    FINDINGS:  Viable female infant in cephalic presentation.  Apgars 8 and 9.  Light meconium stained amniotic fluid.  Intact placenta, three vessel cord.  Normal uterus, fallopian tubes and ovaries bilaterally.  ANESTHESIA: Spinal INTRAVENOUS FLUIDS: 3000 ml ESTIMATED BLOOD LOSS: 800 ml URINE OUTPUT:  75 ml SPECIMENS: Placenta sent to L&D COMPLICATIONS: None immediate  PROCEDURE IN DETAIL:  The patient preoperatively received intravenous antibiotics and had sequential compression devices applied to her lower extremities.  She was then taken to the operating room where spinal anesthesia was administered and was found to be adequate. She was then placed in a dorsal supine position with a leftward tilt, and prepped and draped in a  sterile manner.  A foley catheter was placed into her bladder and attached to constant gravity.  After an adequate timeout was performed, a Pfannenstiel skin incision was made with scalpel over her preexisting scar, and carried through to the underlying layer of fascia.  The fascia was noted to be very tough due to the previous cesarean sections. The fascia was incised in the midline, and this incision was extended bilaterally using the Mayo scissors.  Kocher clamps were applied to the superior aspect of the fascial incision and the underlying rectus muscles were dissected off bluntly and sharply. A similar process was carried out on the inferior aspect of the fascial incision. The rectus muscles were separated in the midline sharply and the peritoneum was entered bluntly. Incisions were made horizontally about 1.5 cm on the rectus muscles bilaterally to obtain good exposure.  Minimal adhesive disease was noted around the uterus Attention was turned to the lower uterine segment where a low transverse hysterotomy was made with a scalpel and extended bilaterally bluntly.  The infant was successfully delivered, the cord was clamped and cut and the infant was handed over to awaiting neonatology team. Uterine massage was then administered, and the placenta delivered intact with a three-vessel cord. The uterus was then cleared of clot and debris.  The hysterotomy was closed with 0 Vicryl in a running locked fashion, and an imbricating layer was also placed with a 0 Vicryl suture.  Further hemostasis was obtained with a few figure of eight stitches on the incision. The pelvis was cleared of all clot and debris. Hemostasis was confirmed on all surfaces.  The fascia was then closed using 0 PDS in a running fashion.  The subcutaneous layer was irrigated, then reapproximated with 2-0 plain gut interrupted stitches, and the skin was closed with a 4-0 Vicryl subcuticular stitch.  30 ml of 0.5% Marcaine was injected subcutaneously  around the incision. The patient tolerated the procedure well. Sponge, lap, instrument and needle counts were correct x 2.  She was taken to the recovery room in stable condition.

## 2012-04-04 NOTE — H&P (Signed)
Obstetric History and Physical  Tanya Cruz is a 36 y.o. G4P3 with IUP at [redacted]w[redacted]d presenting for scheduled repeat cesarean section, two previous cesarean sections.   Prenatal Course Source of Care: [facility] starting at 20 weeks Pregnancy complications or risks: Patient Active Problem List  Diagnosis  . Previous cesarean section complicating pregnancy, antepartum condition or complication  . Language barrier, cultural differences  . Advanced maternal age, antepartum  . Late prenatal care  She plans to breastfeed, plans to bottle feed She desires condoms for postpartum contraception.   Prenatal labs and studies: ABO, Rh: --/--/O POS (12/06 0950) Antibody: NEG (12/06 0950) Rubella: Immune (07/31 0000) RPR: NON REACTIVE (12/06 0950)  HBsAg: Negative (07/31 0000)  HIV: Non-reactive (07/31 0000)  GBS: Negative 1 hr Glucola  119 (early); then 110 Genetic screening normal Anatomy US normal  Prenatal Transfer Tool  Maternal Diabetes: No Genetic Screening: Normal Maternal Ultrasounds/Referrals: Normal Fetal Ultrasounds or other Referrals:  None Maternal Substance Abuse:  No Significant Maternal Medications:  None Significant Maternal Lab Results: Lab values include: Group B Strep negative  Past Medical History  Diagnosis Date  . GERD (gastroesophageal reflux disease)     Past Surgical History  Procedure Date  . Cesarean section     x2  . Foot fracture surgery     OB History    Grav Para Term Preterm Abortions TAB SAB Ect Mult Living   4 3        3     Patient denies any recent cervical dysplasia or STIs.    History   Social History  . Marital Status: Married    Spouse Name: N/A    Number of Children: N/A  . Years of Education: N/A   Social History Main Topics  . Smoking status: Never Smoker   . Smokeless tobacco: Not on file  . Alcohol Use: Not on file  . Drug Use: Not on file  . Sexually Active: Not on file   Other Topics Concern  . Not on file    Social History Narrative  . No narrative on file    No family history on file.  Prescriptions prior to admission  Medication Sig Dispense Refill  . acetaminophen (TYLENOL) 325 MG tablet Take 650 mg by mouth daily as needed. For tooth pain      . docusate sodium (COLACE) 100 MG capsule Take 100 mg by mouth daily as needed. For constipation      . famotidine (PEPCID AC) 10 MG chewable tablet Chew 10 mg by mouth daily as needed. For acid reflux      . Prenatal Vit-Fe Fumarate-FA (PRENATAL MULTIVITAMIN) TABS Take 1 tablet by mouth daily.        No Known Allergies  Review of Systems: Negative except for what is mentioned in HPI.  Physical Exam: BP 115/79  Pulse 88  Temp 98.4 F (36.9 C) (Oral)  Resp 16  SpO2 99% FHR:  138 bpm  GENERAL: Well-developed, well-nourished female in no acute distress.  LUNGS: Clear to auscultation bilaterally.  HEART: Regular rate and rhythm. ABDOMEN: Soft, nontender, nondistended, gravid. EXTREMITIES: Nontender, no edema, 2+ distal pulses.     Pertinent Labs/Studies:   Recent Results (from the past 168 hour(s))  SURGICAL PCR SCREEN   Collection Time   04/01/12  9:48 AM      Component Value Range   MRSA, PCR NEGATIVE  NEGATIVE   Staphylococcus aureus NEGATIVE  NEGATIVE  CBC   Collection Time   04/01/12  9:50 AM      Component Value Range   WBC 7.9  4.0 - 10.5 K/uL   RBC 4.69  3.87 - 5.11 MIL/uL   Hemoglobin 13.6  12.0 - 15.0 g/dL   HCT 16.1  09.6 - 04.5 %   MCV 86.1  78.0 - 100.0 fL   MCH 29.0  26.0 - 34.0 pg   MCHC 33.7  30.0 - 36.0 g/dL   RDW 40.9  81.1 - 91.4 %   Platelets 207  150 - 400 K/uL  RPR   Collection Time   04/01/12  9:50 AM      Component Value Range   RPR NON REACTIVE  NON REACTIVE  TYPE AND SCREEN   Collection Time   04/01/12  9:50 AM      Component Value Range   ABO/RH(D) O POS     Antibody Screen NEG     Sample Expiration 04/01/2012      Assessment : Tanya Cruz is a 36 y.o. G4P3 at [redacted]w[redacted]d being  admitted for scheduled repeat cesarean section, two previous cesarean sections.   Plan: The risks of repeated cesarean section discussed with the patient included but were not limited to: bleeding which may require transfusion or reoperation; infection which may require antibiotics; injury to bowel, bladder, ureters or other surrounding organs; injury to the fetus; need for additional procedures including hysterectomy in the event of a life-threatening hemorrhage; placental abnormalities wth subsequent pregnancies, incisional problems, thromboembolic phenomenon and other postoperative/anesthesia complications. The patient concurred with the proposed plan, giving informed written consent for the procedure.   Patient has been NPO since last night she will remain NPO for procedure. Anesthesia and OR aware. Preoperative prophylactic antibiotics and SCDs ordered on call to the OR.  To OR when ready.   Jaynie Collins, MD, FACOG Attending Obstetrician & Gynecologist Faculty Practice, [facility] [place]

## 2012-04-04 NOTE — Consult Note (Signed)
Neonatology Note:   Attendance at C-section:    I was asked to attend this repeat C/S at term. The mother is a G4P3 O pos, GBS neg with late [facility] and advanced maternal age. ROM at delivery, fluid with thin meconium. Infant vigorous with good spontaneous cry and tone. Needed only minimal bulb suctioning. Ap 9/9. Lungs clear to ausc in DR. To CN to care of Pediatrician.   Deatra James, MD

## 2012-04-04 NOTE — Anesthesia Postprocedure Evaluation (Signed)
  Anesthesia Post-op Note  Patient: Tanya Cruz  Procedure(s) Performed: Procedure(s) (LRB) with comments: CESAREAN SECTION (N/A)  Patient Location: PACU and Mother/Baby  Anesthesia Type:Spinal  Level of Consciousness: awake, alert  and oriented  Airway and Oxygen Therapy: Patient Spontanous Breathing  Post-op Pain: mild  Post-op Assessment: Post-op Vital signs reviewed  Post-op Vital Signs: Reviewed and stable  Complications: No apparent anesthesia complications

## 2012-04-04 NOTE — Anesthesia Procedure Notes (Signed)
Spinal  Patient location during procedure: OR Start time: 04/04/2012 9:44 AM Staffing Anesthesiologist: Brayton Caves R Performed by: anesthesiologist  Preanesthetic Checklist Completed: patient identified, site marked, surgical consent, pre-op evaluation, timeout performed, IV checked, risks and benefits discussed and monitors and equipment checked Spinal Block Patient position: sitting Prep: DuraPrep Patient monitoring: heart rate, cardiac monitor, continuous pulse ox and blood pressure Approach: midline Location: L3-4 Injection technique: single-shot Needle Needle type: Sprotte  Needle gauge: 24 G Needle length: 9 cm Assessment Sensory level: T4 Additional Notes Patient identified.  Risk benefits discussed including failed block, incomplete pain control, headache, nerve damage, paralysis, blood pressure changes, nausea, vomiting, reactions to medication both toxic or allergic, and postpartum back pain.  Patient expressed understanding and wished to proceed.  All questions were answered.  Sterile technique used throughout procedure.  CSF was clear.  No parasthesia or other complications.  Please see nursing notes for vital signs.

## 2012-04-04 NOTE — Anesthesia Postprocedure Evaluation (Signed)
Anesthesia Post Note  Patient: Tanya Cruz  Procedure(s) Performed: Procedure(s) (LRB): CESAREAN SECTION (N/A)  Anesthesia type: Spinal  Patient location: PACU  Post pain: Pain level controlled  Post assessment: Post-op Vital signs reviewed  Last Vitals:  Filed Vitals:   04/04/12 1145  BP: 110/43  Pulse: 67  Temp:   Resp: 18    Post vital signs: Reviewed  Level of consciousness: awake  Complications: No apparent anesthesia complications

## 2012-04-05 ENCOUNTER — Encounter (HOSPITAL_COMMUNITY): Payer: Self-pay | Admitting: Obstetrics & Gynecology

## 2012-04-05 LAB — CBC
HCT: 32.6 % — ABNORMAL LOW (ref 36.0–46.0)
MCHC: 33.4 g/dL (ref 30.0–36.0)
RDW: 15.2 % (ref 11.5–15.5)

## 2012-04-05 NOTE — Progress Notes (Signed)
Subjective: Postpartum Day 1: Cesarean Delivery Patient reports incisional pain, tolerating PO, + flatus and no problems voiding.  Complains of headache that she has had "off and on since she came in yesterday," which was made worse "by the medicine in her IV." States her headache is currently a 5/10, but is a 10/10 when she stands up, and she gets lightheaded if she stands up too quickly. Denies swelling or RUQ pain.  Objective: Vital signs in last 24 hours: Temp:  [97.4 F (36.3 C)-99 F (37.2 C)] 98.5 F (36.9 C) (12/10 0610) Pulse Rate:  [56-88] 85  (12/10 0610) Resp:  [14-22] 18  (12/10 0610) BP: (98-133)/(43-79) 103/67 mmHg (12/10 0610) SpO2:  [95 %-99 %] 98 % (12/10 0610) Weight:  [85.049 kg (187 lb 8 oz)] 85.049 kg (187 lb 8 oz) (12/09 1226)  Physical Exam:  General: alert, cooperative and no distress Lochia: appropriate Uterine Fundus: firm Incision: dressing in place, no gross bleeding/drainage DVT Evaluation: No evidence of DVT seen on physical exam.   Basename 04/05/12 0505  HGB 10.9*  HCT 32.6*    Assessment/Plan: Status post Cesarean section. Doing well postoperatively.  Continue current care. Headache - ?from epidural, pt also received Nubain and ketorolac  -BP's within normal range, no significant LE edema or changes in vision  -will monitor per routine -Tylenol/Percocet for pain -Plan for discharge tomorrow or Wednesday. Breast and bottle feeding. Condoms for contraception. Declines circ.  Alletta Mattos 04/05/2012, 7:50 AM
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
## 2012-04-05 NOTE — Progress Notes (Signed)
UR chart review completed.  

## 2012-04-05 NOTE — Progress Notes (Signed)
I have seen and examined patient and agree with above. Encouraged pt to take PO fluids, ambulate. Uterus firm, at umbilicus. Incision dressing clean/dry/intact. Continue routine cares.  Napoleon Form, MD

## 2012-04-06 MED ORDER — HYDROCODONE-ACETAMINOPHEN 5-325 MG PO TABS
1.0000 | ORAL_TABLET | ORAL | Status: DC | PRN
  Administered 2012-04-06: 1 via ORAL
  Filled 2012-04-06: qty 1

## 2012-04-06 NOTE — Progress Notes (Signed)
Subjective: Postpartum Day 2: Cesarean Delivery Patient reports tolerating PO, + flatus and no problems voiding, still reports incisional pain, itching. Headache: patient reports it has resolved.  Objective: Vital signs in last 24 hours: Temp:  [98 F (36.7 C)-98.4 F (36.9 C)] 98.4 F (36.9 C) (12/11 0534) Pulse Rate:  [79-106] 79  (12/11 0534) Resp:  [16-20] 20  (12/11 0534) BP: (95-105)/(57-72) 95/57 mmHg (12/11 0534) SpO2:  [97 %] 97 % (12/10 1000)  Physical Exam:  General: alert, cooperative and no distress Lochia: appropriate Uterine Fundus: firm Incision: dressing in place, no significant drainage, some bleeding around incision DVT Evaluation: No evidence of DVT seen on physical exam. No cords or calf tenderness.   Basename 04/05/12 0505  HGB 10.9*  HCT 32.6*    Assessment/Plan: Status post Cesarean section. Doing well postoperatively.  Continue current care. Itching: patient has received Narcan, states it is helping. Female, no circumcision, breast feeding, condoms. Discharge tomorrow.   Marnee Spring 04/06/2012, 7:45 AM

## 2012-04-07 DIAGNOSIS — O34219 Maternal care for unspecified type scar from previous cesarean delivery: Principal | ICD-10-CM | POA: Diagnosis not present

## 2012-04-07 LAB — CBC
Hemoglobin: 10.5 g/dL — ABNORMAL LOW (ref 12.0–15.0)
MCH: 29.5 pg (ref 26.0–34.0)
RBC: 3.56 MIL/uL — ABNORMAL LOW (ref 3.87–5.11)

## 2012-04-07 MED ORDER — IBUPROFEN 600 MG PO TABS: 600.0000 mg | ORAL_TABLET | Freq: Four times a day (QID) | ORAL | Status: DC | PRN

## 2012-04-07 MED ORDER — HYDROXYZINE HCL 25 MG PO TABS
25.0000 mg | ORAL_TABLET | Freq: Once | ORAL | Status: AC
  Administered 2012-04-07: 25 mg via ORAL
  Filled 2012-04-07: qty 1

## 2012-04-07 MED ORDER — HYDROXYZINE HCL 25 MG PO TABS: 25.0000 mg | ORAL_TABLET | Freq: Four times a day (QID) | ORAL | Status: DC | PRN

## 2012-04-07 NOTE — Discharge Summary (Signed)
Obstetric Discharge Summary Reason for Admission: Planned Repeat cesarean section Prenatal Procedures: ultrasound Intrapartum Procedures: cesarean: low cervical, transverse Postpartum Procedures: none Complications-Operative and Postpartum: Rash of unkown etiology, improving prior to D/C.  Pt reports new onset of mild dysuria. Hemoglobin  Date Value Range Status  04/05/2012 10.9* 12.0 - 15.0 g/dL Final     HCT  Date Value Range Status  04/05/2012 32.6* 36.0 - 46.0 % Final  CBC 12/12/13____  Physical Exam:  General: alert, cooperative, appears stated age, no distress and moderately obese Lochia: appropriate, although still moderate on day 3 per pt. Uterine Fundus: firm Incision: healing well DVT Evaluation: Negative Homan's sign.  Discharge Diagnoses: Term Pregnancy-delivered and rash of unknown etiology.  Discharge Information: Date: 04/07/2012 Activity: pelvic rest and no lfting > 10 lb x 6 weeks. Diet: routine Medications: Ibuprofen and Vistaril (declines Percocet due to concern that it may have caused rash). Condition: stable Instructions: refer to practice specific booklet and Call HD if rash persists. Urine culture pending. Hospital will notifuy you if you need an Antibiotic.  Discharge to: home Follow-up Information    Follow up with [facility] [place]. In 6 weeks.   Contact information:   [address] [place] [address] [phone]      Follow up with [facility] [place] [facility]. (As needed for complications)    Contact information:   [address] [id number] mc [place] [address] [phone]         Newborn Data: Live born female  Birth Weight: 6 lb 9.5 oz (2990 g) APGAR: 9, 9  Home with mother. Breast feeding Condoms for BC.   Medication List     As of 04/07/2012 11:15 AM    START taking these medications         hydrOXYzine 25 MG tablet   Commonly known as: ATARAX/VISTARIL   Take 1-2 tablets (25-50 mg total) by mouth every 6 (six) hours as needed for itching or anxiety.      ibuprofen 600 MG tablet   Commonly known as: ADVIL,MOTRIN   Take 1 tablet (600 mg total) by mouth every 6 (six) hours as needed for pain.      CONTINUE taking these medications         acetaminophen 325 MG tablet   Commonly known as: TYLENOL      docusate sodium 100 MG capsule   Commonly known as: COLACE      prenatal multivitamin Tabs      STOP taking these medications         famotidine 10 MG chewable tablet   Commonly known as: PEPCID AC          Where to get your medications    These are the prescriptions that you need to pick up. We sent them to a specific pharmacy, so you will need to go there to get them.   [facility] [address] [address], [place] - [address] AT [facility] [address]    [address] [place] [address]    Phone: [phone]    Hours: 24-hours        hydrOXYzine 25 MG tablet   ibuprofen 600 MG tablet             Josephene Marrone 04/07/2012, 11:10 AM

## 2012-04-08 LAB — TYPE AND SCREEN
Antibody Screen: NEGATIVE
Unit division: 0

## 2012-04-10 ENCOUNTER — Other Ambulatory Visit: Payer: Self-pay | Admitting: Obstetrics & Gynecology

## 2012-04-10 DIAGNOSIS — N390 Urinary tract infection, site not specified: Secondary | ICD-10-CM

## 2012-04-10 LAB — URINE CULTURE: Special Requests: NORMAL

## 2012-04-10 MED ORDER — AMOXICILLIN 500 MG PO CAPS: 500.0000 mg | ORAL_CAPSULE | Freq: Three times a day (TID) | ORAL | Status: DC

## 2012-04-10 NOTE — Progress Notes (Signed)
Patient was complaining of dysuria prior to discharge. Urine culture grew out Enterococcus, Amoxicillin eprescribed.  Patient will be called with Spanish interpreter to let her know to pick up medication. This antibiotic is safe with lactation.

## 2012-04-10 NOTE — Progress Notes (Signed)
I have seen and examined patient and agree with above. Will monitor bleeding at left side of incision. Napoleon Form, MD

## 2012-04-13 NOTE — Progress Notes (Signed)
Called pt with Spanish interpreter, Byrd Hesselbach, and informed pt that she has a UTI that an Rx has been sent to her pharmacy.  Pt is take Rx three times a day for 7 days.  If she continues to have symptoms after taking the first Rx to its entirety then she can refill next Rx.  Pt stated that she had a rash all over the body.  Pt had atarax prescribed for the itching and rash in which pt has not picked up Rx.  I advised pt to please pick up Rx for both amox and atarax and she should have some relief. Pt stated understanding and did not have any questions.

## 2014-02-03 ENCOUNTER — Encounter (HOSPITAL_COMMUNITY): Payer: Self-pay | Admitting: Emergency Medicine

## 2014-02-03 ENCOUNTER — Emergency Department (INDEPENDENT_AMBULATORY_CARE_PROVIDER_SITE_OTHER): Payer: Self-pay

## 2014-02-03 ENCOUNTER — Emergency Department (INDEPENDENT_AMBULATORY_CARE_PROVIDER_SITE_OTHER)
Admission: EM | Admit: 2014-02-03 | Discharge: 2014-02-03 | Disposition: A | Discharge status: Home / Self Care | Payer: Self-pay | Source: Home / Self Care

## 2014-02-03 DIAGNOSIS — X501XXA Overexertion from prolonged static or awkward postures, initial encounter: Secondary | ICD-10-CM

## 2014-02-03 DIAGNOSIS — T1490 Injury, unspecified: Secondary | ICD-10-CM

## 2014-02-03 DIAGNOSIS — S93401A Sprain of unspecified ligament of right ankle, initial encounter: Secondary | ICD-10-CM

## 2014-02-03 NOTE — Discharge Instructions (Signed)
Esguince agudo de tobillo, con rehabilitacin fase I (Acute Ankle Sprain with Phase I Rehab)  El esguince agudo de tobillo es un desgarro parcial o completo de uno o ms ligamentos debido a una lesin por traumatismo. La gravedad de la lesin depende de la cantidad de ligamentos esguinzados y el grado del esguince. Hay 3 categoras de esguinces.  El [place]grado 1 es un esguince leve. Hay un ligero estiramiento sin ruptura evidente. No hay prdida de fuerza, y [address] msculo y el ligamento tienen el largo correcto.  El Essexgrado 2 es un esguince moderado. Hay ruptura de las fibras que se encuentran dentro de la sustancia del ligamento, Medical laboratory scientific officeren el lugar en que MGM MIRAGEune dos huesos o Database administratordos cartlagos. El largo del ligamento est aumentado y generalmente hay disminucin de la fuerza.  Un esguince en grado 3 es la ruptura completa del tendn y no es frecuente. Adems del grado del esguince, hay tres tipos de esguince de tobillo. Esguince lateral de tobillo: Ocurre en uno o ms ligamentos de la parte externa (lateral) del tobillo. Aqu se producen las lesiones con ms frecuencia. Esguince interno o medial de tobillo: Hay un ligamento triangular en la parte interna (media) del tobillo que es susceptible a lesiones. El esguince medial de tobillo es menos comn. Esguince de sindesmosis "tobillo superior": La sindesmosis es un ligamento que Colgate Palmoliveune los dos huesos de la parte inferior de la pierna. El esguince de sindesmosis suele ocurrir slo con esguinces muy graves del [place]omerdal[place]obillo. SNTOMAS  Dolor, sensibilidad e hinchazn en el tobillo, que comienza en el lado de la lesin y que con el tiempo puede avanzar hacia todo el tobillo y el pie.  Sensacin de estallido o ruptura en el momento de la lesin.  Hematoma que puede extenderse hasta el taln.  Imposibilidad de caminar poco despus de producida la lesin. CAUSAS  Los esguinces agudos de tobillo se deben a una presin ejercida temporalmente sobre el hueso del tobillo que saca el  astrgalo de su ubicacin normal.  Distensin o ruptura de los ligamentos que normalmente sostienen la articulacin en su lugar (generalmente debido a una torcedura). LOS RIESGOS AUMENTAN CON:  Esguince previo del tobillo.  Actividades en las que el pie se apoya de manera inadecuada (como en el bsquet, el vley y el ftbol) o caminar o correr sobre superficies desparejas o rugosas.  Zapatos sin soporte adecuado que Textron Inceviten los movimientos hacia los lados cuando se produce la presin.  Poca fuerza y flexibilidad.  Dificultad para mantener el equilibrio.  Deportes de contacto. PREVENCIN  Precalentamiento adecuado y elongacin antes de la Duneanactividad.  Mantener la forma fsica:  Flexibilidad del tobillo y la pierna, fuerza y resistencia muscular.  Capacidad cardiovascular.  Actividades que mejoren el equilibrio.  Usar la tcnica correcta y Warehouse managertener un entrenador que corrija la tcnica incorrecta.  Uso de Qatarcinta adhesiva, vendajes, tobilleras o botines que Avayaeviten las lesiones. En un comienzo puede utilizarse Qatarcinta adhesiva; sin embargo pierde su capacidad de sostn a los 10  15 minutos.  Utilice zapatos protectores adecuados (los botines combinados con vendajes o sujetadores es ms efectivo que el uso de slo uno de ellos).  Durante los 12 meses posteriores a la lesin, proteja el tobillo durante la prctica de deportes y Callawayotras actividades. PRONSTICO  Si se trata adecuadamente, el esguince de tobillo podr curarse por completo; sin embargo, el tiempo de recuperacin depende del grado del esguince.  Un esguince de grado 1 est lo suficientemente curado The Krogerentre los 5 y [address] das  como para permitir realizar actividades modificadas y requiere un promedio de 6 semanas para curarse completamente.  Los esguinces de grado 2 necesitan entre 6 y 10 semanas para curarse completamente.  Los esguinces de grado 3 necesitan entre 12 y 16 semanas para curarse.  Un esguince de sindesmosis  habitualmente demora ms de 3 meses en curarse. COMPLICACIONES RELACIONADAS  La recurrencia frecuente de los sntomas puede dar como resultado un problema crnico. Un tratamiento adecuado del problema la primera vez que ocurre, disminuye la frecuencia de recurrencias y optimiza el tiempo de curacin. La gravedad del esguince inicial no predice la probabilidad de inestabilidad futura.  Lesiones en otras estructuras, (hueso, cartlago o tendn).  Si se repiten los esguinces puede dar lugar a una articulacin crnicamente inestable o artrtica. TRATAMIENTO El tratamiento inicial consiste en la toma de medicamentos y la aplicacin de hielo y vendas por compresin para aliviar el dolor y reducir la hinchazn. El esguince de tobillo suele inmovilizarse con un yeso o bota para permitir la curacin. Podrn recomendarse muletas para reducir la presin en la lesin. Luego de la inmovilizacin podr ser necesario realizar ejercicios de fortalecimiento y estiramiento para ganar fuerza y un rango completo de movimiento. No es frecuente la ciruga para tratar el esguince de tobillo. MEDICAMENTOS  Generalmente se indican antiinflamatorios no esteroides como la aspirina o el ibuprofeno (no los tome durante los 3 das posteriores a la lesin ni dentro de los 7 das previos a la ciruga), u otros calmantes menores como acetaminofeno. Tome los medicamentos como le indic su mdico. Comunquese inmediatamente con el mdico si tiene alguna hemorragia, molestias estomacales o seales de una reaccin alrgica como consecuencia de estos medicamentos.  Podr beneficiarse con ungentos o linimentos tpicos.  Cuando lo necesite, el profesional le prescribir calmantes. No tome medicamentos recetados para el dolor durante ms de 4 a 7 das. Utilcelos como se le indique y slo cuando lo necesite. CALOR Y FRO  El fro se utiliza para aliviar el dolor y reducir la inflamacin para casos agudos y crnicos. El fro debe  aplicarse durante 10 a 15 minutos cada 2  3 horas para reducir la inflamacin y el dolor e inmediatamente despus de cualquier actividad que agrava los sntomas. Utilice bolsas o un masaje de hielo.  El calor debe utilizarse antes de realizar ejercicios de estiramiento y fortalecimiento prescriptos por el mdico. Utilice una bolsa trmica o un pao hmedo. SOLICITE ATENCIN MDICA DE INMEDIATO SI:   Tuviera dolor, hinchazn o hematomas que empeoran a pesar del tratamiento.  Siente dolor, adormecimiento cambios en el color o fro en el pie o en los dedos.  Aparecen sntomas nuevos e inesperados (las drogas utilizadas en el tratamiento pueden producir efectos secundarios). EJERCICIOS  EJERCICIOS FASE I EJERCICIOS DE AMPLITUD DE MOVIMIENTOS Y ELONGACIN Esguince de tobillo agudo, fase I, semanas 1 a 2 Estos ejercicios le ayudarn al comienzo de la rehabilitacin de la lesin. Estos ejercicios generalmente se realizan durante las primeras 1  2 semanas luego de la lesin. Un vez que el mdico, fisioterapeuta o entrenador vea un progreso adecuado, avanzar con los ejercicios. Al completar estos ejercicios, recuerde:   Restaurar la flexibilidad del tejido ayuda a que las articulaciones recuperen el movimiento normal. Esto permite que el movimiento y la actividad sea ms saludables y menos dolorosos.  Para que sea efectiva, cada elongacin debe realizarse durante al menos 30 segundos.  El estiramiento nunca debe debe ser doloroso. Deber sentir slo un alargamiento suave o estiramiento del tejido   que elonga. AMPLITUD DE MOVIMIENTOS Flexin dorso plantar  Mientras est entado con la rodilla derecha / izquierdo derecha, lleve la parte superior del pie hacia arriba flexionando el tobillo. Luego realice el movimiento inverso, y apunte con los pies hacia abajo.  Mantenga esta posicin durante __________ segundos.  Luego de completar la primera serie de ejercicios, reptalo con la rodilla  flexionada. Reptalo __________ veces. Realice este estiramiento __________ veces por da.  AMPLITUD DE MOVIMIENTOS Alfabeto con el tobillo  Imagine que el dedo gordo del pie derecha / izquierdo es un lpiz.  Con la cadera y la rodilla quietas, escriba todo el alfabeto con el "lpiz". Llegue hasta donde pueda, sin que aumenten las molestias. Reptalo __________ veces. Realice este estiramiento __________ veces por da.  EJERCICIOS DE FORTALECIMIENTO Esguince de tobillo agudo, fase I, semanas 1 a 2 Estos ejercicios le ayudarn al comienzo de la rehabilitacin de la fuerza del tobillo. Estos ejercicios generalmente se realizan durante las primeras 1  2 semanas luego de la lesin. Un vez que el mdico, fisioterapeuta o entrenador vea un progreso adecuado, avanzar con los ejercicios. Al completar estos ejercicios, recuerde:   Los msculos pueden ganar tanto la resistencia como la fortaleza que necesita para sus actividades diarias a travs de ejercicios controlados.  Realice los ejercicios como se lo indic el mdico, el fisioterapeuta o el entrenador. Aumente la resistencia y repeticiones segn se le haya indicado.  Podr experimentar dolor o cansancio muscular, pero el dolor o molestia que trata de eliminar a travs de los ejercicios nunca debe empeorar. Si el dolor empeora, detngase y asegrese de que est siguiendo las directivas correctamente. Si an siente dolor luego de realizar lo ajustes necesarios, deber discontinuar el ejercicio hasta que pueda conversar con el profesional sobre el problema. FUERZA Dorsiflexores  Asegure una banda de goma para ejercicios a un objeto fijo (ej, mesa, palo) y enrosque el otro extremo alrededor del pie derecha / izquierdo.  Sintese en el suelo de frente al objeto fijo. La banda debe estar ligeramente tensa cuando su pie est relajado.  Lentamente, lleve el pie hacia atrs con el tobillo y los dedos del pie.  Mantenga esta posicin durante __________  segundos. Libere la tensin de la banda lentamente y coloque el pie en la posicin inicial. Reptalo __________ veces. Realice este estiramiento __________ veces por da.  FUERZA Flexin plantar  Sintese con la pierna derecha / izquierdo extendida. Sostenga por los extremos una banda de goma para ejercicios y colquela alrededor de la regin metatarsiana del pie. Mantenga una tensin suave en la banda.  Empuje los dedos del pie lentamente, hacia el lado contrario a usted, que apunten hacia abajo.  Mantenga esta posicin durante __________ segundos. Vuelva lentamente a la posicin normal, y mantenga una tensin en la banda. Reptalo __________ veces. Realice este estiramiento __________ veces por da.  FUERZA Eversin del tobillo  Asegure un extremo de una banda de goma para ejercicios a un objeto fijo (mesa, palo). Ate el extremo opuesto a su pie, justo antes de los dedos.  Coloque los puos entre las rodillas. Esto har que la fuerza se concentre en el tobillo.  Lleve la banda hacia el pie contrario, lentamente, para que el dedo pequeo del pie salga apunte hacia arriba y hacia afuera. Asegrese de que la banda est posicionada de tal forma que puede resistir el movimiento completo.  Mantenga esta posicin durante __________ segundos.  Haga que los msculos resistan la banda mientras tira lentamente el pie hacia atrs   hasta la posición inicial. °Repítalo __________ veces. Realice este estiramiento __________ veces por día.  °FUERZA - Inversión del tobillo °· Asegure un extremo de una banda de goma para ejercicios a un objeto fijo (mesa, palo). Ate el extremo opuesto a su pie, justo antes de los dedos. °· Coloque los puños entre las rodillas. Esto hará que la fuerza se concentre en el tobillo. °· Lentamente, tire del dedo gordo hacia arriba y hacia adentro y asegúrese de que la banda está posicionada de tal forma que puede resistir el movimiento completo. °· Mantenga esta posición durante  __________ segundos. °· Haga que los músculos resistan la banda mientras tira lentamente el pie hacia atrás hasta la posición inicial. °Repítalo __________ veces. Realice este ejercicio __________ veces por día.  °FUERZA Rollo de toalla °· Siéntese en una silla en una superficie no alfombrada. °· Coloque el pie derecha / izquierdo en una toalla, y mantenga el talón en el suelo. °· Coloque la toalla alrededor del talón pero envuelva sólo los dedos del pie. Mantenga el talón contra el piso. °· Añada peso al extremo de la toalla si el médico, fisioterapeuta o entrenador se lo indican. °Repítalo __________ veces. Realice este estiramiento __________ veces por día. °Document Released: 01/28/2006 Document Revised: 07/06/2011 °ExitCare® Patient Information ©2015 [employer]. This information is not intended to replace advice given to you by your health care provider. Make sure you discuss any questions you have with your health care provider. ° °

## 2014-02-03 NOTE — ED Provider Notes (Signed)
CSN: [id number]     Arrival date & time 02/03/14  1212 History   First MD Initiated Contact with Patient 02/03/14 1332     Chief Complaint  Patient presents with  . Ankle Pain   (Consider location/radiation/quality/duration/timing/severity/associated sxs/prior Treatment) HPI Comments: Twisted right ankle about 4 days. Has been ambulating. C/O pain primarily to the lateral aspect of the ankle.  Patient is a 38 y.o. female presenting with ankle pain.  Ankle Pain   Past Medical History  Diagnosis Date  . GERD (gastroesophageal reflux disease)    Past Surgical History  Procedure Laterality Date  . Cesarean section      x2  . Foot fracture surgery    . Cesarean section  04/04/2012    Procedure: CESAREAN SECTION;  Surgeon: Tereso NewcomerUgonna A Anyanwu, MD;  Location: [facility];  Service: Obstetrics;  Laterality: N/A;   History reviewed. No pertinent family history. History  Substance Use Topics  . Smoking status: Never Smoker   . Smokeless tobacco: Not on file  . Alcohol Use: Not on file   OB History   Grav Para Term Preterm Abortions TAB SAB Ect Mult Living   4 4 1       3      Review of Systems  Constitutional: Negative.     Allergies  Review of patient's allergies indicates no known allergies.  Home Medications   Prior to Admission medications   Medication Sig Start Date End Date Taking? Authorizing Provider  acetaminophen (TYLENOL) 325 MG tablet Take 650 mg by mouth daily as needed. For tooth pain    Historical Provider, MD  amoxicillin (AMOXIL) 500 MG capsule Take 1 capsule (500 mg total) by mouth 3 (three) times daily. 04/10/12   Tereso NewcomerUgonna A Anyanwu, MD  docusate sodium (COLACE) 100 MG capsule Take 100 mg by mouth daily as needed. For constipation    Historical Provider, MD  hydrOXYzine (ATARAX/VISTARIL) 25 MG tablet Take 1-2 tablets (25-50 mg total) by mouth every 6 (six) hours as needed for itching or anxiety. 04/07/12   Dorathy KinsmanVirginia Smith, CNM  ibuprofen (ADVIL,MOTRIN) 600 MG tablet  Take 1 tablet (600 mg total) by mouth every 6 (six) hours as needed for pain. 04/07/12   Dorathy KinsmanVirginia Smith, CNM  Prenatal Vit-Fe Fumarate-FA (PRENATAL MULTIVITAMIN) TABS Take 1 tablet by mouth daily.    Historical Provider, MD   BP 99/63  Pulse 66  Temp(Src) 98.1 F (36.7 C) (Oral)  Resp 16  SpO2 97% Physical Exam  Nursing note and vitals reviewed. Constitutional: She appears well-developed and well-nourished. No distress.  Pulmonary/Chest: Effort normal. No respiratory distress.  Musculoskeletal:  R ankle with tenderness to lateral aspect of ankle distal to the malleolus. No swelling or deformities. . Pedal pulse 2+ Full rom.    Neurological: She is alert. She exhibits normal muscle tone.  Skin:  Echymosis lateral aspect of the ankle and base of the toes dorsal surface.    Psychiatric: She has a normal mood and affect.    ED Course  Procedures (including critical care time) Labs Review Labs Reviewed - No data to display  Imaging Review No results found.   MDM   1. Injury caused by twisting     ASO splint RICE Limit wt bearing   Hayden Rasmussenavid Llesenia Fogal, NP 02/03/14 [id number]

## 2014-02-03 NOTE — ED Notes (Signed)
Injury to right ankle , inversion type . ecchymosis to foot and ankle, good pulse dorsal. Used tylenol for pain

## 2014-02-03 NOTE — ED Provider Notes (Signed)
Medical screening examination/treatment/procedure(s) were performed by non-physician practitioner and as supervising physician I was immediately available for consultation/collaboration.  Albertus Chiarelli, M.D.  Hyder Deman C Zakayla Martinec, MD 02/03/14 1523 

## 2014-02-26 ENCOUNTER — Encounter (HOSPITAL_COMMUNITY): Payer: Self-pay | Admitting: Emergency Medicine

## 2014-08-30 ENCOUNTER — Ambulatory Visit (INDEPENDENT_AMBULATORY_CARE_PROVIDER_SITE_OTHER): Payer: Self-pay | Admitting: Family Medicine

## 2014-08-30 VITALS — BP 100/60 | HR 77 | Temp 97.9°F | Resp 16 | Ht 59.0 in | Wt 174.0 lb

## 2014-08-30 DIAGNOSIS — N390 Urinary tract infection, site not specified: Secondary | ICD-10-CM

## 2014-08-30 DIAGNOSIS — R1084 Generalized abdominal pain: Secondary | ICD-10-CM

## 2014-08-30 DIAGNOSIS — R300 Dysuria: Secondary | ICD-10-CM

## 2014-08-30 DIAGNOSIS — N898 Other specified noninflammatory disorders of vagina: Secondary | ICD-10-CM

## 2014-08-30 LAB — POCT URINALYSIS DIPSTICK
BILIRUBIN UA: NEGATIVE
GLUCOSE UA: NEGATIVE
Ketones, UA: NEGATIVE
NITRITE UA: NEGATIVE
Protein, UA: 30
Spec Grav, UA: >=1.03
UROBILINOGEN UA: 0.2
pH, UA: 5.5

## 2014-08-30 LAB — POCT WET PREP WITH KOH
KOH PREP POC: NEGATIVE
RBC Wet Prep HPF POC: NEGATIVE
Trichomonas, UA: NEGATIVE
YEAST WET PREP PER HPF POC: NEGATIVE

## 2014-08-30 LAB — POCT UA - MICROSCOPIC ONLY
CRYSTALS, UR, HPF, POC: NEGATIVE
Casts, Ur, LPF, POC: NEGATIVE
Mucus, UA: NEGATIVE
Yeast, UA: NEGATIVE

## 2014-08-30 MED ORDER — CIPROFLOXACIN HCL 500 MG PO TABS: 500.0000 mg | ORAL_TABLET | Freq: Two times a day (BID) | ORAL | Status: DC

## 2014-08-30 NOTE — Patient Instructions (Signed)
Infección urinaria  °(Urinary Tract Infection) ° La infección urinaria puede ocurrir en cualquier lugar del tracto urinario. El tracto urinario es un sistema de drenaje del cuerpo por el que se eliminan los desechos y el exceso de agua. El tracto urinario está formado por dos riñones, dos uréteres, la vejiga y la uretra. Los riñones son órganos que tienen forma de frijol. Cada riñón tiene aproximadamente el tamaño del puño. Están situados debajo de las costillas, uno a cada lado de la columna vertebral °CAUSAS  °La causa de la infección son los microbios, que son organismos microscópicos, que incluyen hongos, virus, y bacterias. Estos organismos son tan pequeños que sólo pueden verse a través del microscopio. Las bacterias son los microorganismos que más comúnmente causan infecciones urinarias.  °SÍNTOMAS  °Los síntomas pueden variar según la edad y el sexo del paciente y por la ubicación de la infección. Los síntomas en las mujeres jóvenes incluyen la necesidad frecuente e intensa de orinar y una sensación dolorosa de ardor en la vejiga o en la uretra durante la micción. Las mujeres y los hombres mayores podrán sentir cansancio, temblores y debilidad y sentir dolores musculares y dolor abdominal. Si tiene fiebre, puede significar que la infección está en los riñones. Otros síntomas son dolor en la espalda o en los lados debajo de las costillas, náuseas y vómitos.  °DIAGNÓSTICO  °Para diagnosticar una infección urinaria, el médico Tanya Cruz preguntará acerca de sus síntomas. También Tanya Cruz solicitará una muestra de orina. La muestra de orina se analiza para detectar bacterias y glóbulos blancos de la sangre. Los glóbulos blancos se forman en el organismo para ayudar a combatir las infecciones.  °TRATAMIENTO  °Por lo general, las infecciones urinarias pueden tratarse con medicamentos. Debido a que la mayoría de las infecciones son causadas por bacterias, por lo general pueden tratarse con antibióticos. La elección del  antibiótico y la duración del tratamiento dependerá de sus síntomas y el tipo de bacteria causante de la infección.  °INSTRUCCIONES PARA EL CUIDADO EN EL HOGAR  °· Si Tanya Cruz recetaron antibióticos, tómelos exactamente como su médico Tanya Cruz indique. Termine el medicamento aunque se sienta mejor después de haber tomado sólo algunos. °· Beba gran cantidad de líquido para mantener la orina de tono claro o color amarillo pálido. °· Evite la cafeína, el té y las bebidas gaseosas. Estas sustancias irritan la vejiga. °· Vaciar la vejiga con frecuencia. Evite retener la orina durante largos períodos. °· Vacíe la vejiga antes y después de tener relaciones sexuales. °· Después de mover el intestino, las mujeres deben higienizarse la región perineal desde adelante hacia atrás. Use sólo un papel tissue por vez. °SOLICITE ATENCIÓN MÉDICA SI:  °· Siente dolor en la espalda. °· Tanya Cruz sube la fiebre. °· Los síntomas no mejoran luego de 3 días. °SOLICITE ATENCIÓN MÉDICA DE INMEDIATO SI:  °· Siente dolor intenso en la espalda o en la zona inferior del abdomen. °· Comienza a sentir escalofríos. °· Tiene náuseas o vómitos. °· Tiene una sensación continua de quemazón o molestias al orinar. °ASEGÚRESE DE QUE:  °· Comprende estas instrucciones. °· Controlará su enfermedad. °· Solicitará ayuda de inmediato si no mejora o empeora. °Document Released: 01/21/2005 Document Revised: 01/06/2012 °ExitCare® Patient Information ©2015 [employer]. This information is not intended to replace advice given to you by your health care provider. Make sure you discuss any questions you have with your health care provider. ° °

## 2014-08-30 NOTE — Progress Notes (Signed)
Chief Complaint:  Chief Complaint  Patient presents with  . Abdominal Pain    Lower x 1 week  . Vaginal Itching    HPI: Tanya Cruz is a 39 y.o. female who is here for UTI symptoms for the last 2-3 weeks. She denies any fevers, has had chills. She's had some back pain on the right side she has had cloudy urine. Minimal vaginal discharge.  She thought it was a yeast infection and had taken Flagyl so without any relief.   Past Medical History  Diagnosis Date  . GERD (gastroesophageal reflux disease)    Past Surgical History  Procedure Laterality Date  . Cesarean section      x2  . Foot fracture surgery    . Cesarean section  04/04/2012    Procedure: CESAREAN SECTION;  Surgeon: Tereso NewcomerUgonna A Anyanwu, MD;  Location: [facility];  Service: Obstetrics;  Laterality: N/A;   History   Social History  . Marital Status: Married    Spouse Name: N/A  . Number of Children: N/A  . Years of Education: N/A   Social History Main Topics  . Smoking status: Never Smoker   . Smokeless tobacco: Not on file  . Alcohol Use: Not on file  . Drug Use: Not on file  . Sexual Activity: Not on file   Other Topics Concern  . None   Social History Narrative   History reviewed. No pertinent family history. No Known Allergies Prior to Admission medications   Medication Sig Start Date End Date Taking? Authorizing Provider  ibuprofen (ADVIL,MOTRIN) 600 MG tablet Take 1 tablet (600 mg total) by mouth every 6 (six) hours as needed for pain. 04/07/12  Yes Dorathy KinsmanVirginia Smith, CNM  Prenatal Vit-Fe Fumarate-FA (PRENATAL MULTIVITAMIN) TABS Take 1 tablet by mouth daily.   Yes Historical Provider, MD  hydrOXYzine (ATARAX/VISTARIL) 25 MG tablet Take 1-2 tablets (25-50 mg total) by mouth every 6 (six) hours as needed for itching or anxiety. Patient not taking: Reported on 08/30/2014 04/07/12   Dorathy KinsmanVirginia Smith, CNM     ROS: The patient denies fevers, night sweats, unintentional weight loss, chest pain,  palpitations, wheezing, dyspnea on exertion,  vomiting, abdominal pain, dysuria, hematuria, melena, numbness, weakness, or tingling.   All other systems have been reviewed and were otherwise negative with the exception of those mentioned in the HPI and as above.    PHYSICAL EXAM: Filed Vitals:   08/30/14 1316  BP: 100/60  Pulse: 77  Temp: 97.9 F (36.6 C)  Resp: 16   Filed Vitals:   08/30/14 1316  Height: 4\' 11"  (1.499 m)  Weight: 174 lb (78.926 kg)   Body mass index is 35.13 kg/(m^2).  General: Alert, no acute distress HEENT:  Normocephalic, atraumatic, oropharynx patent. EOMI, PERRLA Cardiovascular:  Regular rate and rhythm, no rubs murmurs or gallops.  No Carotid bruits, radial pulse intact. No pedal edema.  Respiratory: Clear to auscultation bilaterally.  No wheezes, rales, or rhonchi.  No cyanosis, no use of accessory musculature GI: No organomegaly, abdomen is soft and non-tender, positive bowel sounds.  No masses. Skin: No rashes. Neurologic: Facial musculature symmetric. Psychiatric: Patient is appropriate throughout our interaction. Lymphatic: No cervical lymphadenopathy Musculoskeletal: Gait intact. Positive CVA tenderness on the right side   LABS: Results for orders placed or performed in visit on 08/30/14  POCT UA - Microscopic Only  Result Value Ref Range   WBC, Ur, HPF, POC TNTC    RBC, urine, microscopic 3-5  Bacteria, U Microscopic moderate    Mucus, UA neg    Epithelial cells, urine per micros 5-10    Crystals, Ur, HPF, POC neg    Casts, Ur, LPF, POC neg    Yeast, UA neg   POCT urinalysis dipstick  Result Value Ref Range   Color, UA yellow    Clarity, UA slighty cloudy    Glucose, UA neg    Bilirubin, UA neg    Ketones, UA neg    Spec Grav, UA >=1.030    Blood, UA moderate    pH, UA 5.5    Protein, UA 30    Urobilinogen, UA 0.2    Nitrite, UA neg    Leukocytes, UA large (3+)   POCT Wet Prep with KOH  Result Value Ref Range   Trichomonas,  UA Negative    Clue Cells Wet Prep HPF POC 0-5    Epithelial Wet Prep HPF POC 0-5    Yeast Wet Prep HPF POC neg    Bacteria Wet Prep HPF POC moderate    RBC Wet Prep HPF POC neg    WBC Wet Prep HPF POC 0-2    KOH Prep POC Negative      EKG/XRAY:   Primary [facility]   ASSESSMENT/PLAN: Encounter Diagnoses  Name Primary?  . Vaginal discharge   . Generalized abdominal pain   . Dysuria   . Acute UTI Yes   Declined urine culture Advised to push fluids Prescribe ciprofloxacin 5 mg by mouth twice a day 10 days She had her last menstrual period on April 20, patient states that she is not pregnant. Gave precautions to return as needed  Gross sideeffects, risk and benefits, and alternatives of medications d/w patient. Patient is aware that all medications have potential sideeffects and we are unable to predict every sideeffect or drug-drug interaction that may occur.  Hamilton CapriLE, Gearldine Looney PHUONG, DO 08/30/2014 4:19 PM

## 2014-09-16 ENCOUNTER — Ambulatory Visit (INDEPENDENT_AMBULATORY_CARE_PROVIDER_SITE_OTHER): Payer: Self-pay | Admitting: Emergency Medicine

## 2014-09-16 VITALS — BP 110/60 | HR 73 | Temp 98.5°F | Ht 59.0 in | Wt 178.4 lb

## 2014-09-16 DIAGNOSIS — R1084 Generalized abdominal pain: Secondary | ICD-10-CM

## 2014-09-16 LAB — POCT UA - MICROSCOPIC ONLY
BACTERIA, U MICROSCOPIC: NEGATIVE
CASTS, UR, LPF, POC: NEGATIVE
Crystals, Ur, HPF, POC: NEGATIVE
Mucus, UA: NEGATIVE
YEAST UA: NEGATIVE

## 2014-09-16 LAB — POCT URINALYSIS DIPSTICK
BILIRUBIN UA: NEGATIVE
Glucose, UA: NEGATIVE
KETONES UA: NEGATIVE
LEUKOCYTES UA: NEGATIVE
NITRITE UA: NEGATIVE
PROTEIN UA: NEGATIVE
UROBILINOGEN UA: 0.2
pH, UA: 5.5

## 2014-09-16 LAB — POCT URINE PREGNANCY: PREG TEST UR: NEGATIVE

## 2014-09-16 NOTE — Patient Instructions (Signed)
Disuria (Dysuria) Es el trmino que se aplica al trastorno de dolor al orinar. Hay muchas causas de disuria, pero la ms frecuente es la infeccin del tracto urinario. Un anlisis de orina puede confirmar si tiene una infeccin. Un cultivo de orina demora entre 2 y 3 das. El cultivo de orina confirma que usted o el nio estn enfermos. Deber concurrir a una visita de control debido a que:  Si le realizaron un cultivo, necesitar conocer los resultados y las recomendaciones para el tratamiento.  Si el cultivo de orina fue positivo, deber tomar antibiticos o conocer si los antibiticos que le han prescripto son los correctos para su tipo de infeccin.  Si el cultivo es negativo (no hay infeccin del tracto urinario, debern buscar otras causas o habr que suspender los antibiticos. Puede ser que en el da de hoy le hayan hecho anlisis de laboratorio y no se haya hallado infeccin. Si se realizaron cultivos demorar entre 24 y 48 horas en conocer los resultados. Puede ser que en el da de hoy le hayan tomado radiografas cuyo resultado es normal. No se ha hallado la causa del problema. Las radiografas sern reledas por un radilogo, que se comunicar con usted si encuentra algn resultado adicional. Puede ser que en el da de hoy a usted o a su nio le hayan indicado medicamentos para ayudarlo con su problema hasta que vea al mdico de cabecera. Si mejora, podr consultar con su mdico de cabecera si reapareciera. Si se le han administrado antibiticos (medicamentos que destruyen los grmenes), tmelos como se le han indicado hasta completar el tratamiento. Si se le han realizado anlisis de laboratorio, necesitar buscar los resultados. Deje un nmero telefnico para poder contactarlo. Si esto no es posible, averige cmo debe buscar los resultados. INSTRUCCIONES PARA EL CUIDADO DOMICILIARIO  Beba gran cantidad de lquidos. Para adultos, beba 8 vasos de agua o jugo por da. Para nios, reponga  los lquidos como le indique su mdico.  Vacie la vejiga con frecuencia. Evite retener la orina durante largos perodos.  Despus de una deposicin, las mujeres deben limpiarse desde adelante hacia atrs, usando el papel higinico slo una vez.  Vace la vejiga antes y despus de tener relaciones sexuales.  Tome todos los medicamentos que le han recetado hasta que la infeccin haya desaparecido. Se sentir mejor en algunos das, pero debe tomar TODOS LOS MEDICAMENTOS. Si se le ha administrado Pyridium, problemente la orina sea de un color oscuro. Esto puede hacer que su ropa interior se manche por lo que deber utilizar una toallita protectora.  Evite la cafena, el t, el alcohol y las bebidas carbonatadas, debido a que tienden a irritar la vejiga.  En los hombres, el alcohol puede irritar la prstata.  Utilice los medicamentos de venta libre o de prescripcin para el dolor, el malestar o la fiebre, segn se lo indique el profesional que lo asiste.  Si el profesional que lo asiste le pide que concurra a una cita de seguimiento, es importante asistir a ella. No concurrir a la consulta puede tener como consecuencia una lesin crnica o permanente, dolor, e incapacidad. Si tiene algn problema para asistir a la cita, debe comunicarse con el establecimiento para obtener asistencia. SOLICITE ATENCIN MDICA DE INMEDIATO SI:  Siente dolor en la espalda.  Sube la fiebre.  Si tiene nuseas (ganas de vomitar) o vmitos.  Si el problema no mejora con los medicamentos o empeora. EST SEGURO QUE:  Comprende las instrucciones para el alta mdica.  Controlar   su enfermedad.  Solicitar atencin mdica de inmediato segn las indicaciones. Document Released: 05/03/2007 Document Revised: 07/06/2011 ExitCare Patient Information 2015 [employer]. This information is not intended to replace advice given to you by your health care provider. Make sure you discuss any questions you have with your  health care provider.  

## 2014-09-16 NOTE — Progress Notes (Signed)
Subjective:  Patient ID: Tanya Cruz, female    DOB: 02/09/1976  Age: 39 y.o. MRN: [id number]  CC: Abdominal Pain; Nausea; and Generalized Body Aches   HPI Tanya Cruz presents for patient was seen on 08/30/2014 with a presumed urinary tract infection. Treated with Cipro. She completed the course of the antibiotic treatment is still said she still had symptoms of dysuria and urgency. Denies any fever chills nausea vomiting stool change. She is currently having her menstrual cycle. He is still complaining of lower abdominal pain. She has no history of injury or overuse.  She has had no improvement in her symptoms with medication. It has no other acute symptoms.  Outpatient Prescriptions Prior to Visit  Medication Sig Dispense Refill  . Prenatal Vit-Fe Fumarate-FA (PRENATAL MULTIVITAMIN) TABS Take 1 tablet by mouth daily.    . ciprofloxacin (CIPRO) 500 MG tablet Take 1 tablet (500 mg total) by mouth 2 (two) times daily. 20 tablet 0  . hydrOXYzine (ATARAX/VISTARIL) 25 MG tablet Take 1-2 tablets (25-50 mg total) by mouth every 6 (six) hours as needed for itching or anxiety. (Patient not taking: Reported on 08/30/2014) 30 tablet 2  . ibuprofen (ADVIL,MOTRIN) 600 MG tablet Take 1 tablet (600 mg total) by mouth every 6 (six) hours as needed for pain. 30 tablet 2   No facility-administered medications prior to visit.    ROS Review of Systems  Constitutional: Negative for fever, chills and appetite change.  HENT: Negative for congestion, ear pain, postnasal drip, sinus pressure and sore throat.   Eyes: Negative for pain and redness.  Respiratory: Negative for cough, shortness of breath and wheezing.   Cardiovascular: Negative for leg swelling.  Gastrointestinal: Positive for abdominal pain. Negative for nausea, vomiting, diarrhea, constipation and blood in stool.  Endocrine: Negative for polyuria.  Genitourinary: Positive for dysuria and vaginal bleeding. Negative for  urgency, frequency and flank pain.  Musculoskeletal: Negative for gait problem.  Skin: Negative for rash.  Neurological: Negative for weakness and headaches.  Psychiatric/Behavioral: Negative for confusion and decreased concentration. The patient is not nervous/anxious.     Objective:  BP 110/60 mmHg  Pulse 73  Temp(Src) 98.5 F (36.9 C) (Oral)  Ht 4\' 11"  (1.499 m)  Wt 178 lb 6 oz (80.91 kg)  BMI 36.01 kg/m2  SpO2 98%  LMP 08/15/2014  BP Readings from Last 3 Encounters:  09/16/14 110/60  08/30/14 100/60  02/03/14 99/63    Wt Readings from Last 3 Encounters:  09/16/14 178 lb 6 oz (80.91 kg)  08/30/14 174 lb (78.926 kg)  04/04/12 187 lb 8 oz (85.049 kg)    Physical Exam  Constitutional: She is oriented to person, place, and time. She appears well-developed and well-nourished.  HENT:  Head: Normocephalic and atraumatic.  Eyes: Conjunctivae are normal. Pupils are equal, round, and reactive to light.  Pulmonary/Chest: Effort normal.  Abdominal: Soft. Bowel sounds are normal. She exhibits no distension. There is no tenderness. There is no rebound and no guarding.  Musculoskeletal: She exhibits no edema.  Neurological: She is alert and oriented to person, place, and time.  Skin: Skin is dry.  Psychiatric: She has a normal mood and affect. Her behavior is normal. Thought content normal.    Lab Results  Component Value Date   WBC 8.6 04/07/2012   HGB 10.5* 04/07/2012   HCT 31.2* 04/07/2012   PLT 205 04/07/2012   CREATININE 0.76 08/17/2008      Assessment & Plan:   Tanya Cruz was seen today for  abdominal pain, nausea and generalized body aches.  Diagnoses and all orders for this visit:  Generalized abdominal pain Orders: -     POCT UA - Microscopic Only -     POCT urinalysis dipstick -     POCT urine pregnancy   since she's currently on her menstrual cycle is impossible to use a urinary urinalysis as a source of information about her current condition. Not  interested in having a lab work performed by venipuncture. I think it be worthwhile seeing her back and getting wet prep of her vagina as well as a repeat examination after menses are finished.   I have discontinued Tanya Cruz's ibuprofen, hydrOXYzine, and ciprofloxacin. I am also having her maintain her prenatal multivitamin.  No orders of the defined types were placed in this encounter.    Results for orders placed or performed in visit on 09/16/14  POCT UA - Microscopic Only  Result Value Ref Range   WBC, Ur, HPF, POC 0-2    RBC, urine, microscopic 0-2    Bacteria, U Microscopic neg    Mucus, UA neg    Epithelial cells, urine per micros 1-5    Crystals, Ur, HPF, POC neg    Casts, Ur, LPF, POC neg    Yeast, UA neg   POCT urinalysis dipstick  Result Value Ref Range   Color, UA yellow    Clarity, UA clear    Glucose, UA neg    Bilirubin, UA neg    Ketones, UA neg    Spec Grav, UA <=1.005    Blood, UA large    pH, UA 5.5    Protein, UA neg    Urobilinogen, UA 0.2    Nitrite, UA neg    Leukocytes, UA Negative   POCT urine pregnancy  Result Value Ref Range   Preg Test, Ur Negative    Follow-up: Return in about 1 week (around 09/23/2014).  Carmelina Dane, MD

## 2015-04-03 ENCOUNTER — Ambulatory Visit (INDEPENDENT_AMBULATORY_CARE_PROVIDER_SITE_OTHER): Payer: Self-pay | Admitting: Family Medicine

## 2015-04-03 VITALS — BP 96/62 | HR 71 | Temp 98.9°F | Resp 18 | Ht 59.25 in | Wt 170.2 lb

## 2015-04-03 DIAGNOSIS — H9202 Otalgia, left ear: Secondary | ICD-10-CM

## 2015-04-03 DIAGNOSIS — R519 Headache, unspecified: Secondary | ICD-10-CM

## 2015-04-03 DIAGNOSIS — R51 Headache: Secondary | ICD-10-CM

## 2015-04-03 MED ORDER — GABAPENTIN 300 MG PO CAPS: 300.0000 mg | ORAL_CAPSULE | Freq: Three times a day (TID) | ORAL | Status: DC

## 2015-04-03 MED ORDER — AMOXICILLIN-POT CLAVULANATE 875-125 MG PO TABS: 1.0000 | ORAL_TABLET | Freq: Two times a day (BID) | ORAL | Status: AC

## 2015-04-03 NOTE — Patient Instructions (Addendum)
We will treat you with an antibiotic as if this is a sinus infection.  Please take to completion. I also want you to start the gabapentin at this time.  This appears to be some nerve like irritation, and it needs to be seen by a specialist.  Please await contact for your neurology appointment.   If you develop rash, please let us know.

## 2015-04-03 NOTE — Progress Notes (Signed)
[facility] [address] [place] [address] [phone]  Date:  04/03/2015   Name:  Tanya Cruz   DOB:  01/31/1976   MRN:  [id number]  PCP:  No PCP Per Patient    History of Present Illness:  Tanya Cruz is a 39 y.o. female patient who presents to [facility] for cc of right ear pain.   -Patient reports 3 weeks of ear pain that radiates down the left side of her neck.  She alaso has pain at the back of her head that is radiating toward the right side of ear.  She has pain at her face along the right eye brow cheek and mouth.  It is constant.  Aggravated with laying on her right side where she feels as if here right ear is going to explode.  She has no rash, swelling, or redness along her face or head.  There has been no drainage for the right ear.  No fever, sore throat, or UR symptoms.  This occurs monthly and has effected her for about 6 years.  She feels as if fluid is running down her ear and into her throat when she takes a shower.  She has a hx of perforation years ago along that left ear.  She has tried ibuprofen, and an otc product for "ear infections". Hx of headaches in the past, generally treated with ibuprofen.       Patient Active Problem List   Diagnosis Date Noted  . Previous cesarean delivery, delivered, with or without mention of antepartum condition 04/07/2012  . Language barrier, cultural differences 04/04/2012    Past Medical History  Diagnosis Date  . GERD (gastroesophageal reflux disease)     Past Surgical History  Procedure Laterality Date  . Cesarean section      x2  . Foot fracture surgery    . Cesarean section  04/04/2012    Procedure: CESAREAN SECTION;  Surgeon: Tereso Newcomer, MD;  Location: [facility];  Service: Obstetrics;  Laterality: N/A;    Social History  Substance Use Topics  . Smoking status: Never Smoker   . Smokeless tobacco: None  . Alcohol Use: None    Family History  Problem Relation Age of  Onset  . Diabetes Paternal Grandmother     No Known Allergies  Medication list has been reviewed and updated.  Current Outpatient Prescriptions on File Prior to Visit  Medication Sig Dispense Refill  . Prenatal Vit-Fe Fumarate-FA (PRENATAL MULTIVITAMIN) TABS Take 1 tablet by mouth daily.     No current facility-administered medications on file prior to visit.    ROS ROS otherwise unremarkable unless listed above.   Physical Examination: BP 96/62 mmHg  Pulse 71  Temp(Src) 98.9 F (37.2 C) (Oral)  Resp 18  Ht 4' 11.25" (1.505 m)  Wt 170 lb 3.2 oz (77.202 kg)  BMI 34.08 kg/m2  SpO2 99%  LMP 03/22/2015  Breastfeeding? No Ideal Body Weight: Weight in (lb) to have BMI = 25: 124.6  Physical Exam  Constitutional: She is oriented to person, place, and time. She appears well-developed and well-nourished. No distress.  HENT:  Head: Normocephalic and atraumatic.  Right Ear: Tympanic membrane, external ear and ear canal normal.  Left Ear: Tympanic membrane, external ear and ear canal normal. No drainage or swelling. No mastoid tenderness. Tympanic membrane is not injected, not perforated and not bulging.  No middle ear effusion.  Nose: Mucosal edema and rhinorrhea present. Right sinus exhibits no frontal  sinus tenderness. Left sinus exhibits no frontal sinus tenderness.  Mouth/Throat: No uvula swelling. No oropharyngeal exudate, posterior oropharyngeal edema or posterior oropharyngeal erythema.  No rash or skin lesion within the canal. Tenderness along the upper maxillary sinus of left side..  Eyes: Conjunctivae and EOM are normal. Pupils are equal, round, and reactive to light.  Cardiovascular: Normal rate and regular rhythm.  Exam reveals no gallop, no distant heart sounds and no friction rub.   No murmur heard. Pulses:      Radial pulses are 2+ on the right side, and 2+ on the left side.       Dorsalis pedis pulses are 2+ on the right side, and 2+ on the left side.   Pulmonary/Chest: Effort normal. No respiratory distress. She has no decreased breath sounds. She has no wheezes. She has no rhonchi.  Lymphadenopathy:       Head (right side): No submandibular, no tonsillar, no preauricular and no posterior auricular adenopathy present.       Head (left side): No submandibular, no tonsillar, no preauricular and no posterior auricular adenopathy present.  Neurological: She is alert and oriented to person, place, and time.  Skin: She is not diaphoretic.  Psychiatric: She has a normal mood and affect. Her behavior is normal.     Assessment and Plan: Tanya Cruz is a 39 y.o. female who is here today for ear pain. Will treat for a deep sinus infection, though this could be nerve pain.   At this time neurology consult is appreciated. Diff dx: occipital neuralgia, trigeminal neuralgia, fibromyalgia, sinusitis  Headache, unspecified headache type - Plan: amoxicillin-clavulanate (AUGMENTIN) 875-125 MG tablet, gabapentin (NEURONTIN) 300 MG capsule, Ambulatory referral to Neurology  Facial pain - Plan: Ambulatory referral to Neurology  Left ear pain    Trena PlattStephanie Mileigh Tilley, PA-C [facility] 04/03/2015 1:26 PM

## 2015-04-07 NOTE — Progress Notes (Signed)
Patient discussed with Ms. English. Agree with assessment and plan of care per her note.   

## 2015-10-28 ENCOUNTER — Ambulatory Visit: Payer: Self-pay

## 2016-05-04 DIAGNOSIS — R51 Headache: Secondary | ICD-10-CM | POA: Diagnosis not present

## 2016-05-04 DIAGNOSIS — R6889 Other general symptoms and signs: Secondary | ICD-10-CM | POA: Diagnosis not present

## 2016-05-04 DIAGNOSIS — Z124 Encounter for screening for malignant neoplasm of cervix: Secondary | ICD-10-CM | POA: Diagnosis not present

## 2016-05-04 DIAGNOSIS — Z1231 Encounter for screening mammogram for malignant neoplasm of breast: Secondary | ICD-10-CM | POA: Diagnosis not present

## 2016-05-04 DIAGNOSIS — Z0000 Encounter for general adult medical examination without abnormal findings: Secondary | ICD-10-CM | POA: Diagnosis not present

## 2016-05-21 DIAGNOSIS — Z1231 Encounter for screening mammogram for malignant neoplasm of breast: Secondary | ICD-10-CM | POA: Diagnosis not present

## 2016-06-01 DIAGNOSIS — Z6835 Body mass index (BMI) 35.0-35.9, adult: Secondary | ICD-10-CM | POA: Diagnosis not present

## 2016-06-01 DIAGNOSIS — R51 Headache: Secondary | ICD-10-CM | POA: Diagnosis not present

## 2016-06-01 DIAGNOSIS — R740 Nonspecific elevation of levels of transaminase and lactic acid dehydrogenase [LDH]: Secondary | ICD-10-CM | POA: Diagnosis not present

## 2016-09-28 ENCOUNTER — Ambulatory Visit: Payer: Self-pay | Admitting: Family Medicine

## 2017-07-17 ENCOUNTER — Ambulatory Visit: Payer: BLUE CROSS/BLUE SHIELD | Admitting: Urgent Care

## 2017-07-17 VITALS — BP 110/73 | Temp 99.6°F | Resp 18 | Ht 59.0 in | Wt 188.0 lb

## 2017-07-17 DIAGNOSIS — R102 Pelvic and perineal pain: Secondary | ICD-10-CM | POA: Diagnosis not present

## 2017-07-17 DIAGNOSIS — N3090 Cystitis, unspecified without hematuria: Secondary | ICD-10-CM

## 2017-07-17 DIAGNOSIS — R300 Dysuria: Secondary | ICD-10-CM

## 2017-07-17 DIAGNOSIS — R350 Frequency of micturition: Secondary | ICD-10-CM | POA: Diagnosis not present

## 2017-07-17 LAB — POCT URINALYSIS DIP (MANUAL ENTRY)
Bilirubin, UA: NEGATIVE
Glucose, UA: NEGATIVE mg/dL
Ketones, POC UA: NEGATIVE mg/dL
Nitrite, UA: NEGATIVE
PH UA: 5.5 (ref 5.0–8.0)
SPEC GRAV UA: 1.025 (ref 1.010–1.025)
UROBILINOGEN UA: 0.2 U/dL

## 2017-07-17 LAB — POCT URINE PREGNANCY: PREG TEST UR: NEGATIVE

## 2017-07-17 MED ORDER — SULFAMETHOXAZOLE-TRIMETHOPRIM 800-160 MG PO TABS: 1.0000 | ORAL_TABLET | Freq: Two times a day (BID) | ORAL | 0 refills | Status: DC

## 2017-07-17 NOTE — Addendum Note (Signed)
Addended by: Wallis BambergMANI, Adan Beal on: 07/17/2017 10:51 AM   Modules accepted: Orders

## 2017-07-17 NOTE — Patient Instructions (Addendum)
Infeccin de las vas urinarias, en adultos Urinary Tract Infection, Adult Una infeccin de las vas urinarias (IVU) es una infeccin en cualquier parte de las vas urinarias, que incluyen los riones, los urteres, la vejiga y la uretra. Estos rganos fabrican, almacenan y eliminan la orina del organismo. La IVU puede ser una infeccin de la vejiga (cistitis) o una infeccin renal (pielonefritis). Cules son las causas? Esta infeccin puede deberse a hongos, virus o bacterias. Las bacterias son la causa ms comunes de las IVU. Esta afeccin tambin puede ser provocada por no vaciar la vejiga por completo durante la miccin en repetidas ocasiones. Qu incrementa el riesgo? Es ms probable que esta afeccin se manifieste si:  Usted ignora la necesidad de orinar o retiene la orina durante mucho tiempo.  No vaca la vejiga completamente durante la miccin.  Es una mujer y se limpia de atrs hacia adelante despus de orinar o defecar.  Es un hombre y est circuncidado.  Tiene estreimiento.  Tiene colocado un catter urinario (sonda urinaria) permanente.  Tiene debilitado el sistema de defensa (inmunitario) del cuerpo.  Tiene una enfermedad que afecta los intestinos, los riones o la vejiga.  Tiene diabetes.  Toma antibiticos con frecuencia o durante largos perodos, y los antibiticos ya no resultan eficaces para combatir algunos tipos de infecciones (resistencia a los antibiticos).  Toma medicamentos que le irritan las vas urinarias.  Est expuesto a sustancias qumicas que le irritan las vas urinarias.  Es mujer.  Cules son los signos o los sntomas? Los sntomas de esta afeccin incluyen lo siguiente:  Fiebre.  Miccin frecuente o eliminacin de pequeas cantidades de orina con frecuencia.  Necesidad urgente de orinar.  Ardor o dolor al orinar.  Orina con mal olor u olor atpico.  Orina turbia.  Dolor en la parte baja del abdomen o en la espalda.  Dificultad  para orinar.  Presencia de sangre en la orina.  Tener vmitos o menos apetito de lo normal.  Diarrea o dolor abdominal.  Secrecin vaginal, si es mujer.  Cmo se diagnostica? Esta afeccin se diagnostica con base en la historia clnica y un examen fsico. Tambin deber proporcionar una muestra de orina para realizar anlisis. Podrn indicarle otros estudios, por ejemplo:  Anlisis de sangre.  Anlisis de enfermedades de transmisin sexual (ETS).  Si ha tenido ms de una IVU, se pueden hacer estudios de diagnstico por imgenes o una cistoscopia para determinar la causa de las infecciones. Cmo se trata? El tratamiento de esta afeccin suele incluir una combinacin de dos o ms de los siguientes:  Antibiticos.  Otros medicamentos para tratar causas menos frecuentes de IVU.  Medicamentos de venta libre para aliviar el dolor.  Cantidad suficiente agua para mantenerse hidratado.  Siga estas indicaciones en su casa:  Tome los medicamentos de venta libre y los recetados solamente como se lo haya indicado el mdico.  Si le recetaron un antibitico, tmelo como se lo haya indicado el mdico. No deje de tomar el antibitico aunque comience a sentirse mejor.  Evite el alcohol, la cafena, el t y las bebidas gaseosas. Estas bebidas pueden irritar la vejiga.  Beba suficiente lquido para mantener la orina clara o de color amarillo plido.  Concurra a todas las visitas de seguimiento como se lo haya indicado el mdico. Esto es importante.  Asegrese de lo siguiente: ? Vaciar la vejiga con frecuencia y en su totalidad. No retener la orina durante largos perodos. ? Vaciar la vejiga antes y despus de tener relaciones   sexuales. ? Limpiarse de adelante hacia atrs despus de defecar, si es mujer. Usar cada trozo de papel higinico solo una vez cuando se limpie. Comunquese con un mdico si:  Siente dolor en la espalda.  Tiene fiebre.  Siente nuseas o vomita.  Los sntomas  no mejoran despus de 3das de tratamiento.  Los sntomas desaparecen y luego vuelven a aparecer. Solicite ayuda de inmediato si:  Siente dolor intenso en la espalda o en la zona inferior del abdomen.  Tiene vmitos y no puede tragar los medicamentos ni tomar agua. Esta informacin no tiene como fin reemplazar el consejo del mdico. Asegrese de hacerle al mdico cualquier pregunta que tenga. Document Released: 01/21/2005 Document Revised: 07/29/2016 Document Reviewed: 03/04/2015 Elsevier Interactive Patient Education  2018 Elsevier Inc.     IF you received an x-ray today, you will receive an invoice from [facility]. Please contact [facility] at [phone] with questions or concerns regarding your invoice.   IF you received labwork today, you will receive an invoice from [facility]. Please contact [facility] at [phone] with questions or concerns regarding your invoice.   Our billing staff will not be able to assist you with questions regarding bills from these companies.  You will be contacted with the lab results as soon as they are available. The fastest way to get your results is to activate your My Chart account. Instructions are located on the last page of this paperwork. If you have not heard from us regarding the results in 2 weeks, please contact this office.      

## 2017-07-17 NOTE — Progress Notes (Addendum)
   MRN: [id number] DOB: 09/07/1975  Subjective:   Tanya Cruz is a 42 y.o. female presenting for 5 day history of dysuria, urinary frequency, pelvic pain, cloudy malordorous urine and genital irritation, nausea. She is trying to hydrate aggressively. Denies fever, vomiting, flank pain.   Tanya Cruz has a current medication list which includes the following prescription(s): benzocaine-resorcinol, methenamine-sodium salicylate, gabapentin, and prenatal multivitamin. Patient has No Known Allergies.  Tanya Cruz  has a past medical history of GERD (gastroesophageal reflux disease). Also  has a past surgical history that includes Cesarean section; Foot fracture surgery; and Cesarean section (04/04/2012).  Objective:   Vitals: BP 110/73   Temp 99.6 F (37.6 C) (Oral)   Resp 18   Ht 4\' 11"  (1.499 m)   Wt 188 lb (85.3 kg)   SpO2 98%   BMI 37.97 kg/m   Physical Exam  Constitutional: She is oriented to person, place, and time. She appears well-developed and well-nourished.  HENT:  Mouth/Throat: Oropharynx is clear and moist.  Eyes: No scleral icterus.  Cardiovascular: Normal rate, regular rhythm and intact distal pulses. Exam reveals no gallop and no friction rub.  No murmur heard. Pulmonary/Chest: No respiratory distress. She has no wheezes. She has no rales.  Abdominal: Soft. Bowel sounds are normal. She exhibits no distension and no mass. There is tenderness (lower/pelvic). There is no rebound and no guarding.  Musculoskeletal: She exhibits no edema.  Neurological: She is alert and oriented to person, place, and time.  Skin: Skin is warm and dry. No rash noted. No erythema. No pallor.  Psychiatric: She has a normal mood and affect.    Results for orders placed or performed in visit on 07/17/17 (from the past 24 hour(s))  POCT urinalysis dipstick     Status: Abnormal   Collection Time: 07/17/17 10:07 AM  Result Value Ref Range   Color, UA yellow yellow   Clarity, UA cloudy (A)  clear   Glucose, UA negative negative mg/dL   Bilirubin, UA negative negative   Ketones, POC UA negative negative mg/dL   Spec Grav, UA [id number] - 1.025   Blood, UA trace-intact (A) negative   pH, UA 5.5 5.0 - 8.0   Protein Ur, POC =30 (A) negative mg/dL   Urobilinogen, UA 0.2 0.2 or 1.0 E.U./dL   Nitrite, UA Negative Negative   Leukocytes, UA Large (3+) (A) Negative  POCT urine pregnancy     Status: None   Collection Time: 07/17/17 10:53 AM  Result Value Ref Range   Preg Test, Ur Negative Negative   Assessment and Plan :   Cystitis  Dysuria - Plan: POCT urinalysis dipstick, CANCELED: POCT Microscopic Urinalysis ([facility])  Urinary frequency - Plan: POCT urinalysis dipstick, Urine Culture, CANCELED: POCT Microscopic Urinalysis ([facility])  Pelvic pain in female  Will start Bactrim for cystitis, urine culture pending, advised aggressive hydration. At the end of her visit, patient reported left ear pain for a "long time". Exam reveals perforation but no signs of infection. Counseled on conservative management and symptoms of infection. She is to set up an office visit with Dr. Leretha PolSantiago to establish care.   Wallis BambergMario Detroit Frieden, PA-C [facility] [phone] 07/17/2017 10:06 AM

## 2017-07-21 LAB — URINE CULTURE

## 2017-08-20 ENCOUNTER — Ambulatory Visit (INDEPENDENT_AMBULATORY_CARE_PROVIDER_SITE_OTHER): Payer: BLUE CROSS/BLUE SHIELD | Admitting: Family Medicine

## 2017-08-20 ENCOUNTER — Other Ambulatory Visit: Payer: Self-pay

## 2017-08-20 ENCOUNTER — Ambulatory Visit (INDEPENDENT_AMBULATORY_CARE_PROVIDER_SITE_OTHER): Payer: BLUE CROSS/BLUE SHIELD

## 2017-08-20 ENCOUNTER — Encounter: Payer: Self-pay | Admitting: Family Medicine

## 2017-08-20 ENCOUNTER — Encounter: Payer: BLUE CROSS/BLUE SHIELD | Admitting: Urgent Care

## 2017-08-20 VITALS — BP 108/64 | HR 84 | Temp 98.5°F | Ht 60.63 in | Wt 186.4 lb

## 2017-08-20 DIAGNOSIS — Z0000 Encounter for general adult medical examination without abnormal findings: Secondary | ICD-10-CM | POA: Diagnosis not present

## 2017-08-20 DIAGNOSIS — Z119 Encounter for screening for infectious and parasitic diseases, unspecified: Secondary | ICD-10-CM | POA: Diagnosis not present

## 2017-08-20 DIAGNOSIS — Z131 Encounter for screening for diabetes mellitus: Secondary | ICD-10-CM

## 2017-08-20 DIAGNOSIS — K5900 Constipation, unspecified: Secondary | ICD-10-CM

## 2017-08-20 DIAGNOSIS — N926 Irregular menstruation, unspecified: Secondary | ICD-10-CM

## 2017-08-20 DIAGNOSIS — Z13220 Encounter for screening for lipoid disorders: Secondary | ICD-10-CM

## 2017-08-20 DIAGNOSIS — R300 Dysuria: Secondary | ICD-10-CM

## 2017-08-20 DIAGNOSIS — R1032 Left lower quadrant pain: Secondary | ICD-10-CM

## 2017-08-20 LAB — POCT URINALYSIS DIP (MANUAL ENTRY)
Bilirubin, UA: NEGATIVE
Blood, UA: NEGATIVE
Glucose, UA: NEGATIVE mg/dL
Ketones, POC UA: NEGATIVE mg/dL
Leukocytes, UA: NEGATIVE
Nitrite, UA: NEGATIVE
Protein Ur, POC: NEGATIVE mg/dL
Spec Grav, UA: 1.025 (ref 1.010–1.025)
Urobilinogen, UA: 0.2 E.U./dL
pH, UA: 6 (ref 5.0–8.0)

## 2017-08-20 MED ORDER — POLYETHYLENE GLYCOL 3350 17 GM/SCOOP PO POWD: 17.0000 g | Freq: Every day | ORAL | 1 refills | Status: DC

## 2017-08-20 NOTE — Patient Instructions (Addendum)
IF you received an x-ray today, you will receive an invoice from [facility]. Please contact [facility] at [phone] with questions or concerns regarding your invoice.   IF you received labwork today, you will receive an invoice from [facility]. Please contact [facility] at [phone] with questions or concerns regarding your invoice.   Our billing staff will not be able to assist you with questions regarding bills from these companies.  You will be contacted with the lab results as soon as they are available. The fastest way to get your results is to activate your My Chart account. Instructions are located on the last page of this paperwork. If you have not heard from [place] regarding the results in 2 weeks, please contact this office.     Estreimiento en los adultos Constipation, Adult Se llama estreimiento cuando:  Tiene deposiciones (defeca) una menor cantidad de veces a la semana de lo normal.  Tiene dificultad para defecar.  Las heces son secas y duras o son ms grandes que lo normal.  Siga estas indicaciones en su casa: Comida y bebida   Consuma alimentos con alto contenido de [place], por ejemplo: ? Lambert Mody y verduras frescas. ? Cereales integrales. ? Frijoles.  Consuma una menor cantidad de alimentos ricos en grasas, con bajo contenido de [place] o excesivamente procesados, como: ? Papas fritas. ? Hamburguesas. ? Galletas. ? Caramelos. ? Gaseosas.  Beba suficiente lquido para mantener el pis (orina) claro o de color amarillo plido. Instrucciones generales  Haga actividad fsica con regularidad o segn las indicaciones del mdico.  Vaya al bao cuando sienta la necesidad de defecar. No se aguante las ganas.  Tome los medicamentos de venta libre y los recetados solamente como se lo haya indicado el mdico. Estos incluyen los suplementos de [place].  Realice ejercicios de reentrenamiento del suelo plvico, como: ? Respirar profundamente  mientras relaja la parte inferior del vientre (abdomen). ? Relajar el suelo plvico mientras defeca.  Controle su afeccin para ver si hay cambios.  Concurra a todas las visitas de control como se lo haya indicado el mdico. Esto es importante. Comunquese con un mdico si:  Siente un dolor que empeora.  Tiene fiebre.  No ha defecado por 4das.  Vomita.  No tiene hambre.  Pierde peso.  Tiene una hemorragia en el ano.  Las deposiciones Media planner) son delgadas como un lpiz. Solicite ayuda de inmediato si:  Jaclynn Guarneri, y los sntomas empeoran de repente.  Tiene prdida de materia fecal u observa [employer].  Siente el vientre ms duro o ms grande de lo normal (est hinchado).  Siente un dolor muy intenso en el vientre.  Se siente mareado o se desmaya. Esta informacin no tiene Marine scientist el consejo del mdico. Asegrese de hacerle al mdico cualquier pregunta que tenga. Document Released: 05/16/2010 Document Revised: 07/15/2016 Document Reviewed: 10/02/2015 Elsevier Interactive Patient Education  2018 [place] preventivos en las mujeres de 2 a 3 aos de edad Preventive Care 40-64 Years, Female Los cuidados preventivos hacen referencia a las opciones en cuanto al estilo de vida y a las visitas al mdico, las cuales pueden promover la salud y Musician. Qu incluyen los cuidados preventivos?  Un examen fsico anual. Esto tambin se conoce como control de bienestar anual.  Exmenes dentales [place] al ao.  Exmenes de la vista de rutina. Pregntele al mdico con qu frecuencia debe realizarse un control de la vista.  Opciones personales de  estilo de vida, que incluyen lo siguiente: ? [employer] y las encas a diario. ? Realizar actividad fsica con regularidad. ? Tener una dieta saludable. ? Evitar el consumo de tabaco y drogas. ? Limitar el consumo de bebidas alcohlicas. ? Armed forces training and education officer. ? Tomar una dosis baja de aspirina diariamente a partir de los 29 aos de [place]. ? Tomar los suplementos de vitaminas o minerales como se lo haya indicado el mdico. Qu sucede durante un control de bienestar anual? Los servicios y exmenes de deteccin realizados por su mdico durante el control de bienestar anual dependern de su salud general, factores de riesgo de estilo de vida y los antecedentes familiares de enfermedades. Asesoramiento Su mdico puede preguntarle acerca de:  Consumo de alcohol.  Consumo de tabaco.  Consumo de drogas.  Bienestar emocional.  Bienestar en el hogar y las relaciones personales.  Actividad sexual.  Hbitos de alimentacin.  Trabajo y [place] laboral.  Mtodos anticonceptivos.  El ciclo menstrual.  Antecedentes de embarazo.  Pruebas de deteccin Pueden hacerle las siguientes pruebas o mediciones:  Estatura, peso e ndice de masa muscular [facility]).  Presin arterial.  Niveles de lpidos y colesterol. Estos se pueden verificar cada 5 aos o, con ms frecuencia, si usted tiene ms de 52 aos de edad.  Control de la piel.  Pruebas de deteccin de cncer de pulmn. Es posible que se le realice esta prueba de deteccin a partir de los 74 aos de edad, si ha fumado durante 30 aos un paquete diario y sigue fumando o dej el hbito en algn momento en los ltimos 15 aos.  Prueba de Personnel officer en las heces [facility]). Es posible que se le realice esta prueba todos los aos a partir de los 68 aos de [place].  Sigmoidoscopa o colonoscopa flexible. Es posible que se le realice una sigmoidoscopa cada 5 aos o una colonoscopa cada 10 aos a partir de los 84 aos de [place].  Anlisis de sangre para la deteccin de la hepatitis C.  Anlisis de sangre para la deteccin de la hepatitis B.  Anlisis de enfermedades de transmisin sexual (ETS).  Pruebas de deteccin de la diabetes. Esto se Set designer un control del azcar en la sangre  (glucosa) despus de no haber comido durante un periodo de tiempo (ayuno). Es posible que se le realice esta prueba cada 1 a 3 tres aos.  Mamografa. Se puede realizar cada 1 o 2 aos. Hable con su mdico sobre cundo debe comenzar a Engineer, manufacturing de [place] regular. Esto depende de si tiene antecedentes familiares de cncer de mama o no.  Pruebas de deteccin de cncer relacionado con las mutaciones del BRCA. Es posible que se los deba realizar si tiene antecedentes de cncer de mama, de ovario, de trompas o peritoneal.  Examen plvico y prueba de Papanicolaou. Esto se puede realizar cada 36aos a Renato [place] de los 21aos de edad. A partir de los 30 aos, esto se puede Optometrist cada 5 aos si usted se realiza una prueba de Papanicolaou en combinacin con una prueba de deteccin del virus del papiloma humano (VPH).  Densitometra sea. Esto se realiza para detectar osteoporosis. Se le puede realizar este examen de deteccin si tiene un riesgo alto de tener osteoporosis.  Hable con su mdico para [employer], las opciones de tratamiento y, si corresponde, la necesidad de Optometrist ms pruebas. Vacunas El mdico puede recomendarle que se aplique algunas vacunas, por ejemplo:  Vacuna contra la gripe. Se  recomienda [employer].  Vacuna contra la difteria, ttanos y tos Dietitian (DTPa, DT). Es posible que tenga que aplicarse un refuerzo contra el ttanos y la difteria (DT) cada 10aos.  Vacuna contra la varicela. Es posible que tenga que aplicrsela si no recibi esta vacuna.  Vacuna contra el herpes zster. Es posible que la necesite despus de los 20 aos de edad.  Vacuna contra el sarampin, rubola y paperas (SRP). Es posible que necesite aplicarse al menos una dosis de la vacuna SRP si naci despus de [phone]. Podra tambin necesitar una segunda dosis.  Vacuna antineumoccica conjugada 13 valente (PCV13). Puede necesitar esta vacuna si tiene  determinadas enfermedades y no se vacun anteriormente.  Vacuna antineumoccica de polisacridos (PPSV23). Quizs tenga que aplicarse una o dos dosis si fuma o si sufre determinadas enfermedades.  Vacuna antimeningoccica. Puede necesitar esta vacuna si tiene determinadas afecciones.  Vacuna contra la hepatitis A. Es posible que necesite esta vacuna si tiene ciertas afecciones o si viaja o trabaja en lugares en los que podra estar expuesto a la hepatitis A.  Vacuna contra la hepatitis B. Es posible que necesite esta vacuna si tiene ciertas afecciones o si viaja o trabaja en lugares en los que podra estar expuesto a la hepatitis B.  Vacuna contra antihaemophilus influenzae tipoB (Hib). Puede necesitar esta vacuna si tiene determinadas afecciones.  Hable con el mdico sobre qu pruebas de deteccin y qu vacunas necesita, y con qu frecuencia las necesita. Esta informacin no tiene Marine scientist el consejo del mdico. Asegrese de hacerle al mdico cualquier pregunta que tenga. Document Released: 08/25/2016 Document Revised: 08/25/2016 Document Reviewed: 02/12/2015 Elsevier Interactive Patient Education  2018 [employer].

## 2017-08-20 NOTE — Progress Notes (Signed)
4/26/201910:35 AM  Tanya Cruz 02/22/1976, 42 y.o. female [id number]  Chief Complaint  Patient presents with  . Annual Exam    having polyuria and dysuria for 1 month    HPI:   Patient is a 42 y.o. female  who presents today for annual exam with some concerns  Last CPE Jan 2018 Cervical Cancer Screening: 2018, neg pap and HPV Breast Cancer Screening: 2018, neg Colorectal Cancer Screening: n/a, neg fhx HIV Screening: ordered today STI Screening: ordered today Seasonal Influenza Vaccination: will need this season Td/Tdap Vaccination: 2013 Frequency of Dental evaluation: needs to see dentist Frequency of Eye evaluation: needs to see optho, feels she might need glasses G4P4 Last preg 5 years ago LMP 07/14/17 Irregular menses, dark blood with clots Not on BC, declines Treated for UTI on 07/23/17 with bactrim. Ur cx e coli 25,000 cfu. abx resolved dysuria but not urethral irritation.   [facility] 08/20/2017 07/17/2017 04/03/2015  Decreased Interest 0 0 0  Down, Depressed, Hopeless 0 0 0  PHQ - 2 Score 0 0 0    No Known Allergies  Prior to Admission medications   Medication Sig Start Date End Date Taking? Authorizing Provider    Past Medical History:  Diagnosis Date  . GERD (gastroesophageal reflux disease)     Past Surgical History:  Procedure Laterality Date  . CESAREAN SECTION     x2  . CESAREAN SECTION  04/04/2012   Procedure: CESAREAN SECTION;  Surgeon: Tereso NewcomerUgonna A Anyanwu, MD;  Location: [facility];  Service: Obstetrics;  Laterality: N/A;  . FOOT FRACTURE SURGERY      Social History   Tobacco Use  . Smoking status: Never Smoker  . Smokeless tobacco: Never Used  Substance Use Topics  . Alcohol use: Not on file    Family History  Problem Relation Age of Onset  . Diabetes Paternal Grandmother     Review of Systems  Constitutional: Positive for chills and malaise/fatigue. Negative for fever and weight loss.  HENT: Negative for  congestion, ear pain and sore throat.   Eyes: Positive for blurred vision. Negative for photophobia, pain, discharge and redness.  Respiratory: Negative for cough and shortness of breath.   Cardiovascular: Negative for chest pain, palpitations and leg swelling.  Gastrointestinal: Positive for abdominal pain (LLQ, felt a mass, resolved with massage and BM), constipation and diarrhea. Negative for blood in stool, heartburn, melena, nausea and vomiting.  Genitourinary: Negative for dysuria, frequency, hematuria and urgency.  Musculoskeletal: Negative for falls, joint pain and myalgias.  Neurological: Negative for dizziness, tingling, speech change, focal weakness and headaches.  Psychiatric/Behavioral: Negative for depression. The patient is not nervous/anxious.     OBJECTIVE:  Blood pressure 108/64, pulse 84, temperature 98.5 F (36.9 C), temperature source Oral, height 5' 0.63" (1.54 m), weight 186 lb 6.4 oz (84.6 kg), last menstrual period 07/14/2017, SpO2 97 %.  Physical Exam  Constitutional: She is oriented to person, place, and time. She appears well-developed and well-nourished.  HENT:  Head: Normocephalic and atraumatic.  Right Ear: Hearing, tympanic membrane, external ear and ear canal normal.  Left Ear: Hearing, tympanic membrane, external ear and ear canal normal.  Mouth/Throat: Oropharynx is clear and moist.  Eyes: Pupils are equal, round, and reactive to light. Conjunctivae and EOM are normal.  Neck: Neck supple. No thyromegaly present.  Cardiovascular: Normal rate, regular rhythm, normal heart sounds and intact distal pulses. Exam reveals no gallop and no friction rub.  No murmur heard. Pulmonary/Chest: Effort  normal and breath sounds normal. She has no wheezes. She has no rhonchi. She has no rales. Right breast exhibits no inverted nipple, no mass, no nipple discharge, no skin change and no tenderness. Left breast exhibits no inverted nipple, no mass, no nipple discharge, no  skin change and no tenderness. Breasts are symmetrical.  Abdominal: Soft. Bowel sounds are normal. She exhibits no distension and no mass. There is no hepatosplenomegaly. There is tenderness in the left upper quadrant and left lower quadrant. There is no rebound and no guarding.  Genitourinary: There is no rash or lesion on the right labia. There is no rash or lesion on the left labia. Uterus is not enlarged, not fixed and not tender. Cervix exhibits no motion tenderness and no discharge. Right adnexum displays no mass and no tenderness. Left adnexum displays no mass and no tenderness. No erythema in the vagina. No vaginal discharge found.  Musculoskeletal: Normal range of motion. She exhibits no edema.  Lymphadenopathy:    She has no cervical adenopathy.    She has no axillary adenopathy.       Right: No supraclavicular adenopathy present.       Left: No supraclavicular adenopathy present.  Neurological: She is alert and oriented to person, place, and time. She has normal reflexes.  Skin: Skin is warm and dry.  Nursing note and vitals reviewed.   Results for orders placed or performed in visit on 08/20/17 (from the past 24 hour(s))  POCT urinalysis dipstick     Status: Abnormal   Collection Time: 08/20/17 10:11 AM  Result Value Ref Range   Color, UA yellow yellow   Clarity, UA cloudy (A) clear   Glucose, UA negative negative mg/dL   Bilirubin, UA negative negative   Ketones, POC UA negative negative mg/dL   Spec Grav, UA 1.914 7.829 - 1.025   Blood, UA negative negative   pH, UA 6.0 5.0 - 8.0   Protein Ur, POC negative negative mg/dL   Urobilinogen, UA 0.2 0.2 or 1.0 E.U./dL   Nitrite, UA Negative Negative   Leukocytes, UA Negative Negative    Dg Abd 1 View  Result Date: 08/20/2017 CLINICAL DATA:  Left lower quadrant pain for 2 weeks EXAM: ABDOMEN - 1 VIEW COMPARISON:  08/18/2008 FINDINGS: Moderate stool burden throughout the colon. There is a non obstructive bowel gas pattern. No  supine evidence of free air. No organomegaly or suspicious calcification. No acute bony abnormality. IMPRESSION: Moderate stool burden.  No acute findings. Electronically Signed   By: Charlett Nose M.D.   On: 08/20/2017 10:52     ASSESSMENT and PLAN 1. Annual physical exam No concerns per history or exam. Routine HCM labs ordered. HCM reviewed/discussed. Anticipatory guidance regarding healthy weight, lifestyle and choices given.   2. Dysuria - POCT urinalysis dipstick - Urine Culture - WET PREP FOR TRICH, YEAST, CLUE  3. Abdominal pain, LLQ - CBC with Differential/Platelet - Comprehensive metabolic panel - GC/Chlamydia Probe Amp([facility]) - DG Abd 1 View; Future  4. Screening for diabetes mellitus - Hemoglobin A1c  5. Irregular menses - POCT urine pregnancy - TSH  6. Screening for lipid disorders - Lipid panel  7. Screening examination for infectious disease - HIV antibody (with reflex)  8. Constipation, unspecified constipation type Discussed supportive measures, new meds r/se/b and RTC precautions. Patient educational handout given. - polyethylene glycol powder (GLYCOLAX/MIRALAX) powder; Take 17 g by mouth daily.  Return in about 1 year (around 08/21/2018), or if symptoms worsen or fail  to improve.    Rutherford Guys, MD Primary Care at [facility] [place], [place] [address] Ph.  [phone] Fax [phone]

## 2017-08-21 LAB — COMPREHENSIVE METABOLIC PANEL
ALT: 55 IU/L — ABNORMAL HIGH (ref 0–32)
AST: 36 IU/L (ref 0–40)
Albumin/Globulin Ratio: 1.1 — ABNORMAL LOW (ref 1.2–2.2)
Albumin: 4.1 g/dL (ref 3.5–5.5)
Alkaline Phosphatase: 74 IU/L (ref 39–117)
BUN/Creatinine Ratio: 18 (ref 9–23)
BUN: 14 mg/dL (ref 6–24)
Bilirubin Total: 0.2 mg/dL (ref 0.0–1.2)
CO2: 19 mmol/L — ABNORMAL LOW (ref 20–29)
Calcium: 9 mg/dL (ref 8.7–10.2)
Chloride: 101 mmol/L (ref 96–106)
Creatinine, Ser: 0.77 mg/dL (ref 0.57–1.00)
GFR calc Af Amer: 109 mL/min/{1.73_m2} (ref >59)
GFR calc non Af Amer: 94 mL/min/{1.73_m2} (ref >59)
Globulin, Total: 3.7 g/dL (ref 1.5–4.5)
Glucose: 99 mg/dL (ref 65–99)
Potassium: 3.6 mmol/L (ref 3.5–5.2)
Sodium: 138 mmol/L (ref 134–144)
Total Protein: 7.8 g/dL (ref 6.0–8.5)

## 2017-08-21 LAB — LIPID PANEL
Chol/HDL Ratio: 4 ratio (ref 0.0–4.4)
Cholesterol, Total: 145 mg/dL (ref 100–199)
HDL: 36 mg/dL — ABNORMAL LOW (ref >39)
LDL Calculated: 84 mg/dL (ref 0–99)
Triglycerides: 126 mg/dL (ref 0–149)
VLDL Cholesterol Cal: 25 mg/dL (ref 5–40)

## 2017-08-21 LAB — CBC WITH DIFFERENTIAL/PLATELET
Basophils Absolute: 0 10*3/uL (ref 0.0–0.2)
Basos: 1 %
EOS (ABSOLUTE): 0.2 10*3/uL (ref 0.0–0.4)
Eos: 4 %
Hematocrit: 35.6 % (ref 34.0–46.6)
Hemoglobin: 11.2 g/dL (ref 11.1–15.9)
Immature Grans (Abs): 0 10*3/uL (ref 0.0–0.1)
Immature Granulocytes: 0 %
Lymphocytes Absolute: 2.6 10*3/uL (ref 0.7–3.1)
Lymphs: 44 %
MCH: 23.6 pg — ABNORMAL LOW (ref 26.6–33.0)
MCHC: 31.5 g/dL (ref 31.5–35.7)
MCV: 75 fL — ABNORMAL LOW (ref 79–97)
Monocytes Absolute: 0.5 10*3/uL (ref 0.1–0.9)
Monocytes: 9 %
Neutrophils Absolute: 2.4 10*3/uL (ref 1.4–7.0)
Neutrophils: 42 %
Platelets: 288 10*3/uL (ref 150–379)
RBC: 4.75 x10E6/uL (ref 3.77–5.28)
RDW: 17.8 % — ABNORMAL HIGH (ref 12.3–15.4)
WBC: 5.8 10*3/uL (ref 3.4–10.8)

## 2017-08-21 LAB — WET PREP FOR TRICH, YEAST, CLUE
Clue Cell Exam: NEGATIVE
Trichomonas Exam: NEGATIVE
Yeast Exam: NEGATIVE

## 2017-08-21 LAB — URINE CULTURE: Organism ID, Bacteria: NO GROWTH

## 2017-08-21 LAB — TSH: TSH: 1.04 u[IU]/mL (ref 0.450–4.500)

## 2017-08-21 LAB — HIV ANTIBODY (ROUTINE TESTING W REFLEX): HIV Screen 4th Generation wRfx: NONREACTIVE

## 2017-08-21 LAB — HEMOGLOBIN A1C
Est. average glucose Bld gHb Est-mCnc: 140 mg/dL
Hgb A1c MFr Bld: 6.5 % — ABNORMAL HIGH (ref 4.8–5.6)

## 2017-08-22 LAB — GC/CHLAMYDIA PROBE AMP
Chlamydia trachomatis, NAA: NEGATIVE
Neisseria gonorrhoeae by PCR: NEGATIVE

## 2017-08-25 ENCOUNTER — Encounter: Payer: Self-pay | Admitting: Family Medicine

## 2017-08-27 NOTE — Progress Notes (Signed)
Tried to call patient two times no voicemail is set up and I did not get an answer.

## 2017-09-13 DIAGNOSIS — L282 Other prurigo: Secondary | ICD-10-CM | POA: Diagnosis not present

## 2017-09-14 ENCOUNTER — Ambulatory Visit: Payer: BLUE CROSS/BLUE SHIELD | Admitting: Family Medicine

## 2018-01-05 ENCOUNTER — Ambulatory Visit: Payer: BLUE CROSS/BLUE SHIELD | Admitting: Physician Assistant

## 2018-01-05 ENCOUNTER — Other Ambulatory Visit: Payer: Self-pay

## 2018-01-05 ENCOUNTER — Encounter: Payer: Self-pay | Admitting: Physician Assistant

## 2018-01-05 VITALS — BP 108/79 | HR 85 | Temp 99.1°F | Resp 18 | Ht 60.35 in | Wt 181.0 lb

## 2018-01-05 DIAGNOSIS — H1013 Acute atopic conjunctivitis, bilateral: Secondary | ICD-10-CM

## 2018-01-05 DIAGNOSIS — H547 Unspecified visual loss: Secondary | ICD-10-CM

## 2018-01-05 MED ORDER — NAPHAZOLINE-PHENIRAMINE 0.025-0.3 % OP SOLN: 2.0000 [drp] | Freq: Four times a day (QID) | OPHTHALMIC | 0 refills | Status: DC | PRN

## 2018-01-05 NOTE — Patient Instructions (Addendum)
For eye issues, please use drops as prescribed.  I have placed a referral for eye doctor.  They should contact you within the next 2 weeks to schedule an appointment.  Please follow-up with Tanya Cruz to discuss your prior lab work.    Para problemas oculares, utilice gotas segn lo prescrito.  He hecho una referencia para Product manager.  Deben ponerse en contacto con usted dentro de las prximas 2 semanas para programar una cita.  Por favor, haga un seguimiento con el Tanya Cruz para discutir su trabajo de laboratorio previo.   Conjuntivitis alrgica (Allergic Conjunctivitis) Una membrana delgada y transparente (conjuntiva) cubre la parte blanca del ojo y la superficie interna del prpado. La conjuntivitis alrgica se produce cuando esta membrana se irrita, lo que es consecuencia de las [place]. Entre las cosas comunes (alrgenos) que pueden causar una reaccin alrgica, se incluyen las siguientes:  Polvo.  Polen.  Moho.  Animales: ? El pelo. ? El pelaje. ? La piel. ? La saliva u otros lquidos de los [place]. Esta afeccin puede hacer que los ojos tengan un color rojo o rosa. Tambin puede causar [employer] ojos. Esta afeccin no se transmite de [place] persona a la otra (no contagiosa). CUIDADOS EN EL HOGAR  Tome o aplquese los medicamentos solamente como se lo haya indicado el mdico.  Evite tocarse o frotarse los ojos.  Aplquese un pao limpio y fro en el ojo durante 10a , 3 o 4veces al [employer].  Si [place] lentes de contacto, no las use hasta que la irritacin se haya ido. Mientras tanto, use anteojos.  Evite usar [employer] ojos hasta que la irritacin se haya ido.  Trate de evitar el alrgeno que le est causando la Automotive engineer.  SOLICITE AYUDA SI:  Los sntomas empeoran.  Le supura pus de los ojos.  Aparecen nuevos sntomas.  Tiene fiebre.  Esta informacin no tiene Theme park manager el consejo del mdico. Asegrese de hacerle al  mdico cualquier pregunta que tenga. Document Released: 04/02/2011 Document Revised: 05/04/2014 Document Reviewed: 01/23/2014 Elsevier Interactive Patient Education  2017 [employer].   IF you received an x-ray today, you will receive an invoice from [facility]. Please contact [facility] at [phone] with questions or concerns regarding your invoice.   IF you received labwork today, you will receive an invoice from [place]. Please contact [facility] at [phone] with questions or concerns regarding your invoice.   Our billing staff will not be able to assist you with questions regarding bills from these companies.  You will be contacted with the lab results as soon as they are available. The fastest way to get your results is to activate your My Chart account. Instructions are located on the last page of this paperwork. If you have not heard from [place] regarding the results in 2 weeks, please contact this office.

## 2018-01-05 NOTE — Progress Notes (Signed)
Tanya Cruz  MRN: [id number] DOB: 12-10-1975  Subjective:  Tanya Cruz is a 42 y.o. female seen in office today for a chief complaint of eye issues x one year. They are very watery throughout the day, has associated itchy.  Wants to scratch smaller time.  Notes she wakes up each morning and has little bit of crust in them. Denies severe red eye, photophobia, foreign body sensation, double vision, decreased vision, fever, sneezing, nasal congestion, and ear pain. Denies contact lens use or eyeglasses. Denies acute trauma. Has never tried anything for relief. Has never seen an eye doctors. Has PMH of seasonal allergies, takes zyrtec sometimes.  Denies smoking.  Review of Systems Per HPI  Patient Active Problem List   Diagnosis Date Noted  . Previous cesarean delivery, delivered, with or without mention of antepartum condition 04/07/2012  . Language barrier, cultural differences 04/04/2012    Current Outpatient Medications on File Prior to Visit  Medication Sig Dispense Refill  . polyethylene glycol powder (GLYCOLAX/MIRALAX) powder Take 17 g by mouth daily. 250 g 1   No current facility-administered medications on file prior to visit.     No Known Allergies   Objective:  BP 108/79   Pulse 85   Temp 99.1 F (37.3 C) (Oral)   Resp 18   Ht 5' 0.35" (1.533 m)   Wt 181 lb (82.1 kg)   LMP 12/09/2017   SpO2 97%   BMI 34.94 kg/m   Physical Exam  Constitutional: She is oriented to person, place, and time. She appears well-developed and well-nourished.  HENT:  Head: Normocephalic and atraumatic. Head is without right periorbital erythema and without left periorbital erythema.  Right Ear: External ear and ear canal normal. Tympanic membrane is not erythematous and not bulging. A middle ear effusion is present.  Left Ear: External ear and ear canal normal. Tympanic membrane is not erythematous and not bulging. A middle ear effusion is present.  Nose: Mucosal  edema ( moderate b/l) present.  Mouth/Throat: Uvula is midline, oropharynx is clear and moist and mucous membranes are normal.  Eyes: Pupils are equal, round, and reactive to light. EOM are normal. Lids are everted and swept, no foreign bodies found. Right eye exhibits discharge (mild watery discharge). Right eye exhibits no hordeolum. Left eye exhibits discharge (mild watery discharge). Right conjunctiva is injected (mildly injected). Left conjunctiva is injected (mildy injected). No scleral icterus.  Fundoscopic exam:      The right eye shows no exudate, no hemorrhage and no papilledema. The right eye shows red reflex.       The left eye shows no exudate, no hemorrhage and no papilledema. The left eye shows red reflex.  Normal visual fields bilaterally.  No pain with palpation of periorbital regions bilaterally.  Neck: Normal range of motion.  Pulmonary/Chest: Effort normal.  Neurological: She is alert and oriented to person, place, and time.  Skin: Skin is warm and dry.  Psychiatric: She has a normal mood and affect.  Vitals reviewed.     Visual Acuity Screening   Right eye Left eye Both eyes  Without correction: 20/25 20/30 20/40   With correction:       Assessment and Plan :  1. Decreased visual acuity - Ambulatory referral to Ophthalmology  2. Allergic conjunctivitis of both eyes History and physical exam findings consistent with allergic conjunctivitis.  No red flags noted on eye exam.  Recommend Naphcon-A eyedrops.  Also recommend she start Zyrtec daily.  Patient's visual acuity is  20/40.  Will refer to eye doctor at this time for complete eye exam.  Advised to return to clinic if symptoms worsen, do not improve, or as needed. - naphazoline-pheniramine (NAPHCON-A) 0.025-0.3 % ophthalmic solution; Place 2 drops into both eyes 4 (four) times daily as needed for eye irritation.  Dispense: 15 mL; Refill: 0   Benjiman Core PA-C  Primary Care at [facility]  [facility] 01/05/2018 9:14 AM

## 2018-01-20 ENCOUNTER — Ambulatory Visit: Payer: BLUE CROSS/BLUE SHIELD | Admitting: Family Medicine

## 2018-02-24 ENCOUNTER — Encounter: Payer: Self-pay | Admitting: Family Medicine

## 2018-02-24 ENCOUNTER — Other Ambulatory Visit: Payer: Self-pay

## 2018-02-24 ENCOUNTER — Ambulatory Visit: Payer: BLUE CROSS/BLUE SHIELD | Admitting: Family Medicine

## 2018-02-24 VITALS — BP 103/73 | HR 70 | Temp 98.2°F | Ht 60.35 in | Wt 186.6 lb

## 2018-02-24 DIAGNOSIS — R739 Hyperglycemia, unspecified: Secondary | ICD-10-CM

## 2018-02-24 DIAGNOSIS — M2550 Pain in unspecified joint: Secondary | ICD-10-CM

## 2018-02-24 DIAGNOSIS — G5623 Lesion of ulnar nerve, bilateral upper limbs: Secondary | ICD-10-CM | POA: Diagnosis not present

## 2018-02-24 MED ORDER — MELOXICAM 7.5 MG PO TABS: 7.5000 mg | ORAL_TABLET | Freq: Every day | ORAL | 0 refills | Status: DC

## 2018-02-24 NOTE — Progress Notes (Signed)
10/31/20198:53 AM  Tanya Cruz 1976-02-17, 42 y.o. female [id number]  Chief Complaint  Patient presents with  . Foot Swelling    follow up, having pain in feet, hands and elbows. This pain has been going on for months.  Happen during the start of monthly period. Using no meds for the pain    HPI:   Patient is a 42 y.o. female who presents today for concerns of swollen and painful arms and legs  She reports several months of constant BUE pain, achy From elbows down to hands Worse on week prior to her menses Pain is worse when she tries to grip, or wrist flexion At one time felt swollen but not currently BLE from kness down feel the same Leg swelling resolved with resolution of constipation Denies any redness or warmth of joints Denies any numbness or tingling Denies any focal weakness Has not tried anything for this pain Denies any fhx inflammatory arthritis She is worried because she had an aunt die suddenly and only symptoms were painful bones She is right handed  She reports not being notified of her a1c 6.5 in April 2019  Fall Risk  02/24/2018 01/05/2018 08/20/2017 07/17/2017  Falls in the past year? No No No No     Depression screen [facility] 2/9 02/24/2018 01/05/2018 08/20/2017  Decreased Interest 0 0 0  Down, Depressed, Hopeless 0 0 0  PHQ - 2 Score 0 0 0    No Known Allergies  Prior to Admission medications   Medication Sig Start Date End Date Taking? Authorizing Provider  naphazoline-pheniramine (NAPHCON-A) 0.025-0.3 % ophthalmic solution Place 2 drops into both eyes 4 (four) times daily as needed for eye irritation. 01/05/18  Yes Barnett Abu, [place] D, PA-C  polyethylene glycol powder (GLYCOLAX/MIRALAX) powder Take 17 g by mouth daily. Patient not taking: Reported on 02/24/2018 08/20/17   Myles Lipps, MD    Past Medical History:  Diagnosis Date  . GERD (gastroesophageal reflux disease)     Past Surgical History:  Procedure Laterality Date  .  CESAREAN SECTION     x2  . CESAREAN SECTION  04/04/2012   Procedure: CESAREAN SECTION;  Surgeon: Tereso Newcomer, MD;  Location: [facility];  Service: Obstetrics;  Laterality: N/A;  . FOOT FRACTURE SURGERY      Social History   Tobacco Use  . Smoking status: Never Smoker  . Smokeless tobacco: Never Used  Substance Use Topics  . Alcohol use: Never    Frequency: Never    Family History  Problem Relation Age of Onset  . Hyperlipidemia Mother   . Diabetes Father   . Diabetes Paternal Grandmother     ROS Per hpi  OBJECTIVE:  Blood pressure 103/73, pulse 70, temperature 98.2 F (36.8 C), temperature source Oral, height 5' 0.35" (1.533 m), weight 186 lb 9.6 oz (84.6 kg), last menstrual period 02/20/2018, SpO2 98 %. Body mass index is 36.02 kg/m.   Physical Exam  Constitutional: She is oriented to person, place, and time. She appears well-developed and well-nourished.  HENT:  Head: Normocephalic and atraumatic.  Mouth/Throat: Mucous membranes are normal.  Eyes: Pupils are equal, round, and reactive to light. Conjunctivae and EOM are normal. No scleral icterus.  Neck: Neck supple.  Pulmonary/Chest: Effort normal.  Musculoskeletal: She exhibits no edema.  Neurological: She is alert and oriented to person, place, and time.  Skin: Skin is warm and dry.  Psychiatric: She has a normal mood and affect.  Nursing note and vitals reviewed.  BUE, FROM, no synovitis, warmth or erythema + elbow flexion test, + phalen, - tinel Normal strength and reflexes BLE" FROM, normal strength, sensation and reflexes, nontender   ASSESSMENT and PLAN  1. Arthralgia, unspecified joint Exam suggestive of ulnar neuropathy. Labs to r/o inflammatory causes - Sedimentation Rate - C-reactive protein - Rheumatoid factor - ANA,IFA RA Diag Pnl w/rflx Tit/Patn - CYCLIC CITRUL PEPTIDE ANTIBODY, IGG/IGA - Comprehensive metabolic panel  2. Hyperglycemia Discussed importance of low carb diet, regular  exercise and healthy weight.  - Hemoglobin A1c - Comprehensive metabolic panel  3. Ulnar neuropathy of both upper extremities Discussed supportive measures, new meds r/se/b and RTC precautions. Patient educational handout given. - meloxicam (MOBIC) 7.5 MG tablet; Take 1 tablet (7.5 mg total) by mouth daily.  Return in about 4 weeks (around 03/24/2018) for hyperglycemia.    Myles Lipps, MD Primary Care at [facility] [address] [place], [place] [address] Ph.  [phone] Fax [phone]

## 2018-02-24 NOTE — Patient Instructions (Addendum)
   If you have lab work done today you will be contacted with your lab results within the next 2 weeks.  If you have not heard from us then please contact us. The fastest way to get your results is to register for My Chart.   IF you received an x-ray today, you will receive an invoice from [facility]. Please contact [facility] at [phone] with questions or concerns regarding your invoice.   IF you received labwork today, you will receive an invoice from [facility]. Please contact [facility] at [phone] with questions or concerns regarding your invoice.   Our billing staff will not be able to assist you with questions regarding bills from these companies.  You will be contacted with the lab results as soon as they are available. The fastest way to get your results is to activate your My Chart account. Instructions are located on the last page of this paperwork. If you have not heard from us regarding the results in 2 weeks, please contact this office.     Diabetes mellitus y nutricin Diabetes Mellitus and Nutrition Si sufre de diabetes (diabetes mellitus), es muy importante tener hbitos alimenticios saludables debido a que sus niveles de azcar en la sangre (glucosa) se ven afectados en gran medida por lo que come y bebe. Comer alimentos saludables en las cantidades adecuadas, aproximadamente a la misma hora todos los das, lo ayudar a:  Controlar la glucemia.  Disminuir el riesgo de sufrir una enfermedad cardaca.  Mejorar la presin arterial.  Alcanzar o mantener un peso saludable.  Todas las personas que sufren de diabetes son diferentes y cada una tiene necesidades diferentes en cuanto a un plan de alimentacin. El mdico puede recomendarle que trabaje con un especialista en dietas y nutricin (nutricionista) para elaborar el mejor plan para usted. Su plan de alimentacin puede variar segn factores como:  Las caloras que necesita.  Los medicamentos  que toma.  Su peso.  Sus niveles de glucemia, presin arterial y colesterol.  Su nivel de actividad.  Otras afecciones que tenga, como enfermedades cardacas o renales.  Cmo me afectan los carbohidratos? Los carbohidratos afectan el nivel de glucemia ms que cualquier otro tipo de alimento. La ingesta de carbohidratos naturalmente aumenta la cantidad glucosa en la sangre. El recuento de carbohidratos es un mtodo destinado a llevar un registro de la cantidad de carbohidratos que se ingieren. El recuento de carbohidratos es importante para mantener la glucemia a un nivel saludable, en especial si utiliza insulina o toma determinados medicamentos por va oral para la diabetes. Es importante saber la cantidad de carbohidratos que se pueden ingerir en cada comida sin correr ningn riesgo. Esto es diferente en cada persona. El nutricionista puede ayudarlo a calcular la cantidad de carbohidratos que debe ingerir en cada comida y colacin. Los alimentos que contienen carbohidratos incluyen:  Pan, cereal, arroz, pasta y galletas.  Papas y maz.  Guisantes, frijoles y lentejas.  Leche y yogur.  Frutas y jugo.  Postres, como pasteles, galletitas, helado y caramelos.  Cmo me afecta el alcohol? El alcohol puede provocar disminuciones sbitas de la glucemia (hipoglucemia), en especial si utiliza insulina o toma determinados medicamentos por va oral para la diabetes. La hipoglucemia es una afeccin potencialmente mortal. Los sntomas de la hipoglucemia (somnolencia, mareos y confusin) son similares a los sntomas de haber consumido demasiado alcohol. Si el mdico afirma que el alcohol es seguro para usted, siga estas pautas:  Limite el consumo de alcohol a no   ms de 1 medida por da si es mujer y no est embarazada, y a 2 medidas si es hombre. Una medida equivale a 12oz (355ml) de cerveza, 5oz (148ml) de vino o 1oz (44ml) de bebidas de alta graduacin alcohlica.  No beba con el  estmago vaco.  Mantngase hidratado con agua, gaseosas dietticas o t helado sin azcar.  Tenga en cuenta que las gaseosas comunes, los jugos y otros refrescos pueden contener mucha azcar y se deben contar como carbohidratos.  Consejos para seguir este plan Leer las etiquetas de los alimentos  Comience por controlar el tamao de la porcin en la etiqueta. La cantidad de caloras, carbohidratos, grasas y otros nutrientes mencionados en la etiqueta se basan en una porcin del alimento. Muchos alimentos contienen ms de una porcin por envase.  Verifique la cantidad total de gramos (g) de carbohidratos totales en una porcin. Puede calcular la cantidad de porciones de carbohidratos al dividir el total de carbohidratos por 15. Por ejemplo, si un alimento posee un total de 30g de carbohidratos, equivale a 2 porciones de carbohidratos.  Verifique la cantidad de gramos (g) de grasas saturadas y grasas trans en una porcin. Escoja alimentos que no contengan grasa o que tengan un bajo contenido.  Controle la cantidad de miligramos (mg) de sodio en una porcin. La mayora de las personas deben limitar la ingesta de sodio total a menos de 2300mg por da.  Siempre consulte la informacin nutricional de los alimentos etiquetados como "con bajo contenido de grasa" o "sin grasa". Estos alimentos pueden ser ms altos en azcar agregada o en carbohidratos refinados y deben evitarse.  Hable con el nutricionista para identificar sus objetivos diarios en cuanto a los nutrientes mencionados en la etiqueta. De compras  Evite comprar alimentos procesados, enlatados o prehechos. Estos alimentos tienden a tener mayor cantidad de grasa, sodio y azcar agregada.  Compre en la zona exterior de la tienda de comestibles. Esta incluye frutas y vegetales frescos, granos a granel, carnes frescas y productos lcteos frescos. Coccin  Utilice mtodos de coccin a baja temperatura, como hornear, en lugar de mtodos de  coccin a alta temperatura, como frer en abundante aceite.  Cocine con aceites saludables, como el aceite de oliva, canola o girasol.  Evite cocinar con manteca, crema o carnes con alto contenido de grasa. Planificacin de las comidas  Consuma las comidas y las colaciones de forma regular, preferentemente a la misma hora todos los das. Evite pasar largos perodos de tiempo sin comer.  Consuma alimentos ricos en fibra, como frutas frescas, verduras, frijoles y cereales integrales. Consulte al nutricionista sobre cuntas porciones de carbohidratos puede consumir en cada comida.  Consuma entre 4 y 6 onzas de protenas magras por da, como carnes magras, pollo, pescado, huevos o tofu. 1 onza equivale a 1 onza de carne, pollo o pescado, 1 huevo, o 1/4 taza de tofu.  Coma algunos alimentos por da que contengan grasas saludables, como aguacates, frutos secos, semillas y pescado. Estilo de vida   Controle su nivel de glucemia con regularidad.  Haga ejercicio al menos 30minutos, 5das o ms por semana, o como se lo haya indicado el mdico.  Tome los medicamentos como se lo haya indicado el mdico.  No consuma ningn producto que contenga nicotina o tabaco, como cigarrillos y cigarrillos electrnicos. Si necesita ayuda para dejar de fumar, consulte al mdico.  Trabaje con un asesor o instructor en diabetes para identificar estrategias para controlar el estrs y cualquier desafo emocional y social. Cules   son algunas de las preguntas que puedo hacerle a mi mdico?  Es necesario que me rena con un instructor en diabetes?  Es necesario que me rena con un nutricionista?  A qu nmero puedo llamar si tengo preguntas?  Cules son los mejores momentos para controlar la glucemia? Dnde encontrar ms informacin:  Asociacin Americana de la Diabetes (American Diabetes Association): diabetes.org/food-and-fitness/food  Academia de Nutricin y Diettica (Academy of Nutrition and  Dietetics): www.eatright.org/resources/health/diseases-and-conditions/diabetes  Instituto Nacional de la Diabetes y las Enfermedades Digestivas y Renales (National Institute of Diabetes and Digestive and Kidney Diseases) (Institutos Nacionales de Salud, NIH): www.niddk.nih.gov/health-information/diabetes/overview/diet-eating-physical-activity Resumen  Un plan de alimentacin saludable lo ayudar a controlar la glucemia y mantener un estilo de vida saludable.  Trabajar con un especialista en dietas y nutricin (nutricionista) puede ayudarlo a elaborar el mejor plan de alimentacin para usted.  Tenga en cuenta que los carbohidratos y el alcohol tienen efectos inmediatos en sus niveles de glucemia. Es importante contar los carbohidratos y consumir alcohol con prudencia. Esta informacin no tiene como fin reemplazar el consejo del mdico. Asegrese de hacerle al mdico cualquier pregunta que tenga. Document Released: 07/21/2007 Document Revised: 08/03/2016 Document Reviewed: 08/03/2016 Elsevier Interactive Patient Education  2018 Elsevier Inc.  

## 2018-02-25 LAB — COMPREHENSIVE METABOLIC PANEL
ALT: 49 IU/L — ABNORMAL HIGH (ref 0–32)
AST: 33 IU/L (ref 0–40)
Albumin/Globulin Ratio: 1.3 (ref 1.2–2.2)
Albumin: 4.2 g/dL (ref 3.5–5.5)
Alkaline Phosphatase: 92 IU/L (ref 39–117)
BUN/Creatinine Ratio: 23 (ref 9–23)
BUN: 16 mg/dL (ref 6–24)
Bilirubin Total: 0.3 mg/dL (ref 0.0–1.2)
CO2: 21 mmol/L (ref 20–29)
Calcium: 9.2 mg/dL (ref 8.7–10.2)
Chloride: 104 mmol/L (ref 96–106)
Creatinine, Ser: 0.71 mg/dL (ref 0.57–1.00)
GFR calc Af Amer: 120 mL/min/{1.73_m2} (ref >59)
GFR calc non Af Amer: 104 mL/min/{1.73_m2} (ref >59)
Globulin, Total: 3.2 g/dL (ref 1.5–4.5)
Glucose: 107 mg/dL — ABNORMAL HIGH (ref 65–99)
Potassium: 4.3 mmol/L (ref 3.5–5.2)
Sodium: 141 mmol/L (ref 134–144)
Total Protein: 7.4 g/dL (ref 6.0–8.5)

## 2018-02-25 LAB — ANA,IFA RA DIAG PNL W/RFLX TIT/PATN
ANA Titer 1: POSITIVE — AB
Cyclic Citrullin Peptide Ab: 9 units (ref 0–19)
Rhuematoid fact SerPl-aCnc: 18.3 IU/mL — ABNORMAL HIGH (ref 0.0–13.9)

## 2018-02-25 LAB — FANA STAINING PATTERNS: Speckled Pattern: 1:1280 {titer} — ABNORMAL HIGH

## 2018-02-25 LAB — HEMOGLOBIN A1C
Est. average glucose Bld gHb Est-mCnc: 134 mg/dL
Hgb A1c MFr Bld: 6.3 % — ABNORMAL HIGH (ref 4.8–5.6)

## 2018-02-25 LAB — C-REACTIVE PROTEIN: CRP: 3 mg/L (ref 0–10)

## 2018-02-25 LAB — SEDIMENTATION RATE: Sed Rate: 37 mm/hr — ABNORMAL HIGH (ref 0–32)

## 2018-02-25 NOTE — Addendum Note (Signed)
Addended by: Myles Lipps on: 02/25/2018 08:07 PM   Modules accepted: Orders

## 2018-03-01 ENCOUNTER — Encounter: Payer: Self-pay | Admitting: *Deleted

## 2018-03-30 ENCOUNTER — Encounter: Payer: Self-pay | Admitting: Family Medicine

## 2018-03-30 ENCOUNTER — Ambulatory Visit (INDEPENDENT_AMBULATORY_CARE_PROVIDER_SITE_OTHER): Payer: BLUE CROSS/BLUE SHIELD

## 2018-03-30 ENCOUNTER — Other Ambulatory Visit: Payer: Self-pay

## 2018-03-30 ENCOUNTER — Ambulatory Visit (INDEPENDENT_AMBULATORY_CARE_PROVIDER_SITE_OTHER): Payer: BLUE CROSS/BLUE SHIELD | Admitting: Family Medicine

## 2018-03-30 VITALS — BP 102/68 | HR 81 | Temp 98.7°F | Ht 60.35 in | Wt 180.0 lb

## 2018-03-30 DIAGNOSIS — R079 Chest pain, unspecified: Secondary | ICD-10-CM | POA: Diagnosis not present

## 2018-03-30 DIAGNOSIS — K219 Gastro-esophageal reflux disease without esophagitis: Secondary | ICD-10-CM

## 2018-03-30 DIAGNOSIS — R5383 Other fatigue: Secondary | ICD-10-CM

## 2018-03-30 DIAGNOSIS — M2550 Pain in unspecified joint: Secondary | ICD-10-CM

## 2018-03-30 DIAGNOSIS — R0602 Shortness of breath: Secondary | ICD-10-CM | POA: Diagnosis not present

## 2018-03-30 LAB — POCT CBC
Granulocyte percent: 48.1 %G (ref 37–80)
HCT, POC: 39.2 % (ref 29–41)
Hemoglobin: 13.4 g/dL (ref 9.5–13.5)
Lymph, poc: 2.9 (ref 0.6–3.4)
MCH, POC: 27 pg (ref 27–31.2)
MCHC: 34.2 g/dL (ref 31.8–35.4)
MCV: 79.1 fL (ref 76–111)
MID (cbc): 0.6 (ref 0–0.9)
MPV: 7.1 fL (ref 0–99.8)
POC Granulocyte: 3.2 (ref 2–6.9)
POC LYMPH PERCENT: 42.9 %L (ref 10–50)
POC MID %: 9 %M (ref 0–12)
Platelet Count, POC: 245 10*3/uL (ref 142–424)
RBC: 4.96 M/uL (ref 4.04–5.48)
RDW, POC: 17.2 %
WBC: 6.7 10*3/uL (ref 4.6–10.2)

## 2018-03-30 MED ORDER — OMEPRAZOLE 20 MG PO CPDR: 20.0000 mg | DELAYED_RELEASE_CAPSULE | Freq: Two times a day (BID) | ORAL | 3 refills | Status: DC

## 2018-03-30 NOTE — Patient Instructions (Addendum)
[facility]: [phone]     If you have lab work done today you will be contacted with your lab results within the next 2 weeks.  If you have not heard from [place] then please contact us. The fastest way to get your results is to register for My Chart.   IF you received an x-ray today, you will receive an invoice from [facility]. Please contact [facility] at [phone] with questions or concerns regarding your invoice.   IF you received labwork today, you will receive an invoice from [facility]. Please contact [facility] at [phone] with questions or concerns regarding your invoice.   Our billing staff will not be able to assist you with questions regarding bills from these companies.  You will be contacted with the lab results as soon as they are available. The fastest way to get your results is to activate your My Chart account. Instructions are located on the last page of this paperwork. If you have not heard from [place] regarding the results in 2 weeks, please contact this office.     Opciones de alimentos para pacientes adultos con enfermedad de reflujo gastroesofgico Food Choices for Gastroesophageal Reflux Disease, Adult Si tiene enfermedad de reflujo gastroesofgico (ERGE), los alimentos que consume y los hbitos de alimentacin son muy importantes. Elegir los alimentos adecuados puede ayudar a Paramedic las molestias ocasionadas por la [employer]. Considere la posibilidad de trabajar con un especialista en dieta y nutricin (nutricionista) que lo ayude a Software engineer saludables. Qu pautas generales debo seguir? Plan de alimentacin  Elija alimentos saludables con bajo contenido de grasa, como frutas, verduras, cereales integrales, productos lcteos descremados, carne magra de vaca, de pescado y de ave.  Haga comidas pequeas con frecuencia en lugar de tres comidas abundantes al da. Coma lentamente, en un ambiente distendido. Evite agacharse o recostarse  hasta despus de 2 o 3horas de haber comido.  Limite los alimentos con alto contenido graso como las carnes grasas o los alimentos fritos.  Limite el consumo de [place], [place] y [place] a menos de 8 cucharaditas al Futures trader.  Evite lo siguiente: ? Consumir alimentos que le ocasionen sntomas. Pueden ser distintos para cada persona. Lleve un registro de los alimentos para identificar aquellos que le ocasionen sntomas. ? Consumir alcohol. ? Beber grandes cantidades de lquido con las comidas. ? Comer 2 o 3 horas antes de acostarse.  Cocine los alimentos utilizando mtodos que no sean la fritura. Esto puede Writer, Technical sales engineer y hervir. Estilo de vida   Mantenga un peso saludable. Pregunte a su mdico cul es el peso saludable para usted. Si debe perder peso, hable con su mdico para hacerlo de manera segura.  Realice actividad fsica durante, al menos, 30 minutos 5 das por semana o ms, o segn lo indicado por su mdico.  Evite usar ropa ajustada alrededor de la cintura y Naval architect.  No consuma ningn producto que contenga nicotina o tabaco, como cigarrillos y Administrator, Civil Service. Si necesita ayuda para dejar de fumar, consulte al mdico.  Duerma con la cabecera de la cama elevada. Use una cua debajo del colchn o bloques debajo del armazn de la cama para Pharmacologist la cabecera de la cama elevada. Qu alimentos no se recomiendan? Esta podra no ser [employer]. Hable con el nutricionista sobre las mejores opciones alimenticias para usted. Carbohidratos Pasteles o panes sin levadura con grasa agregada. Tostadas francesas. Verduras Verduras fritas en abundante aceite. Papas fritas. Cualquier verdura que est preparada con grasa agregada.  Cualquier verdura que le ocasione sntomas. Para algunas personas, estas pueden incluir tomates y productos con tomate, [place] El[place]ajes, cebollas y [place]ajo, y rbanos picantes. Frutas Cualquier fruta que est preparada con grasa agregada.  Cualquier fruta que le ocasione sntomas. Para algunas personas, estas pueden incluir, las frutas ctricas como naranja, pomelo, pia y limn. Carnes y otros alimentos ricos en protenas Carnes de alto contenido graso como carne grasa de vaca o cerdo, salchichas, costillas, Pinedalejamn, salchicha, salame y tocino. Carnes o protenas fritas, lo que incluye pescado frito y pollo frito. Nueces y Brightwoodmantequillas de frutos secos. Lcteos Leche entera y Qu[place]oleche con chocolate. PPG IndustriesCrema cida. Crema. Helados. Queso crema. Batidos con WPS Resourcesleche. Bebidas Caf y t negro, con o sin cafena. Bebidas carbonatadas. Refrescos. Bebidas energizantes. Jugo de fruta hecho con frutas cidas (como naranja o pomelo). Jugo de tomate. Bebidas alcohlicas. Grasas y Barnes & Nobleaceites Mantequilla. Margarina. Lardo. Mantequilla clarificada. Dulces y postres Chocolate y cacao. Rosquillas. Condimentos y otros alimentos TyroneshirePimienta. Menta y mentol. Cualquier condimento, hierbas o aderezos que le ocasionen sntomas. Para algunas personas, esto puede incluir curry, salsa picante o aderezos para ensalada a base de vinagre. Resumen  Si tiene enfermedad de reflujo gastroesofgico (ERGE), las elecciones de alimentos y Brookvilleestilo de vida son muy importantes para ayudar a Paramedicaliviar las molestias de la [place] VeniceERGE.  Haga comidas pequeas con frecuencia en lugar de tres comidas abundantes al da. Coma lentamente, en un ambiente distendido. Evite agacharse o recostarse hasta despus de 2 o 3horas de haber comido.  Limite los alimentos con alto contenido graso como la carne grasa o los alimentos fritos. Esta informacin no tiene Theme park managercomo fin reemplazar el consejo del mdico. Asegrese de hacerle al mdico cualquier pregunta que tenga. Document Released: 01/21/2005 Document Revised: 08/03/2016 Document Reviewed: 02/15/2013 Elsevier Interactive Patient Education  [employer]2018 Elsevier Inc.

## 2018-03-30 NOTE — Progress Notes (Signed)
12/4/201911:12 AM  Tanya Cruz 01/26/1976, 42 y.o. female [id number]  Chief Complaint  Patient presents with  . Hyperlipidemia  . Pain    has not been taking the medication for the feet, hand and elbow pain. Says it made the pain worse. Has yet to recieve call for Rhuematology appt    HPI:   Patient is a 42 y.o. female with past medical history significant for hyerpglycemia, arthralgias who presents today for followup  We updated phone number in the system  Has stopped eating tortillas She has not been able to walk as much as its has been getting cold She is looking to buy a treadmill  She stopped taking meloxicam as she felt it made her hand pain worse  She has been feeling dizzy She also reports several occasions of chest pressure, SOB Has noticed most when she lies down, but has also happend when she has gone out for walk Discomfort lasts about 2 hours, cant get comfortable Denies any associated nausea, diaphoresis Denies any heartburn, denies any palpitations Former smoker, quit about 20 years, smoked for maybe 3-4 years MGM has heart disease, but unsure what kind  Denies any changes in her stools Denies any black tarry stools or red blood   Lab Results  Component Value Date   HGBA1C 6.3 (H) 02/24/2018   Lab Results  Component Value Date   LDLCALC 84 08/20/2017   Fall Risk  03/30/2018 02/24/2018 01/05/2018 08/20/2017 07/17/2017  Falls in the past year? 0 No No No No     [facility] 03/30/2018 02/24/2018 01/05/2018  Decreased Interest 0 0 0  Down, Depressed, Hopeless 0 0 0  PHQ - 2 Score 0 0 0    No Known Allergies  Prior to Admission medications   Medication Sig Start Date End Date Taking? Authorizing Provider    Past Medical History:  Diagnosis Date  . GERD (gastroesophageal reflux disease)     Past Surgical History:  Procedure Laterality Date  . CESAREAN SECTION     x2  . CESAREAN SECTION  04/04/2012   Procedure:  CESAREAN SECTION;  Surgeon: Tereso NewcomerUgonna A Anyanwu, MD;  Location: [facility];  Service: Obstetrics;  Laterality: N/A;  . FOOT FRACTURE SURGERY      Social History   Tobacco Use  . Smoking status: Never Smoker  . Smokeless tobacco: Never Used  Substance Use Topics  . Alcohol use: Never    Frequency: Never    Family History  Problem Relation Age of Onset  . Hyperlipidemia Mother   . Diabetes Father   . Diabetes Paternal Grandmother     ROS Per hpi  OBJECTIVE:  Blood pressure 102/68, pulse 81, temperature 98.7 F (37.1 C), temperature source Oral, height 5' 0.35" (1.533 m), weight 180 lb (81.6 kg), last menstrual period 03/16/2018, SpO2 96 %. Body mass index is 34.75 kg/m.   Wt Readings from Last 3 Encounters:  03/30/18 180 lb (81.6 kg)  02/24/18 186 lb 9.6 oz (84.6 kg)  01/05/18 181 lb (82.1 kg)    Physical Exam  Constitutional: She is oriented to person, place, and time. She appears well-developed and well-nourished.  HENT:  Head: Normocephalic and atraumatic.  Mouth/Throat: Oropharynx is clear and moist. No oropharyngeal exudate.  Eyes: Pupils are equal, round, and reactive to light. Conjunctivae and EOM are normal. No scleral icterus.  Neck: Neck supple.  Cardiovascular: Normal rate, regular rhythm and normal heart sounds. Exam reveals no gallop and no friction rub.  No  murmur heard. Pulmonary/Chest: Effort normal and breath sounds normal. She has no wheezes. She has no rales.  Abdominal: Soft. Bowel sounds are normal. She exhibits no distension and no mass. There is no tenderness.  Musculoskeletal: She exhibits no edema.  Lymphadenopathy:    She has no cervical adenopathy.  Neurological: She is alert and oriented to person, place, and time.  Skin: Skin is warm and dry.  Psychiatric: She has a normal mood and affect.  Nursing note and vitals reviewed.   Results for orders placed or performed in visit on 03/30/18 (from the past 24 hour(s))  POCT CBC     Status: None     Collection Time: 03/30/18 11:46 AM  Result Value Ref Range   WBC 6.7 4.6 - 10.2 K/uL   Lymph, poc 2.9 0.6 - 3.4   POC LYMPH PERCENT 42.9 10 - 50 %L   MID (cbc) 0.6 0 - 0.9   POC MID % 9.0 0 - 12 %M   POC Granulocyte 3.2 2 - 6.9   Granulocyte percent 48.1 37 - 80 %G   RBC 4.96 4.04 - 5.48 M/uL   Hemoglobin 13.4 9.5 - 13.5 g/dL   HCT, POC 16.1 29 - 41 %   MCV 79.1 76 - 111 fL   MCH, POC 27.0 27 - 31.2 pg   MCHC 34.2 31.8 - 35.4 g/dL   RDW, POC 09.6 %   Platelet Count, POC 245 142 - 424 K/uL   MPV 7.1 0 - 99.8 fL    Dg Chest 2 View  Result Date: 03/30/2018 CLINICAL DATA:  Shortness of breath EXAM: CHEST - 2 VIEW COMPARISON:  None. FINDINGS: The heart size and mediastinal contours are within normal limits. Both lungs are clear. The visualized skeletal structures are unremarkable. IMPRESSION: No active cardiopulmonary disease. No evidence of pneumonia pulmonary edema. Electronically Signed   By: Bary Richard M.D.   On: 03/30/2018 12:20    My interpretation of EKG:  NSR, HR 71, no st changes  ASSESSMENT and PLAN  1. Gastroesophageal reflux disease, esophagitis presence not specified Discussed supportive measures, new meds r/se/b and RTC precautions. Patient educational handout given.  2. Chest pain, unspecified type - EKG 12-Lead  3. SOB (shortness of breath) - DG Chest 2 View; Future  4. Fatigue, unspecified type - POCT CBC - TSH - Comprehensive metabolic panel - Urinalysis, dipstick only  5. Arthralgia, unspecified joint - Ambulatory referral to Rheumatology  Other orders - omeprazole (PRILOSEC) 20 MG capsule; Take 1 capsule (20 mg total) by mouth 2 (two) times daily before a meal.    Return for after rheum.    Myles Lipps, MD Primary [facility] at [facility] [address] [place], [place] [address] Ph.  [phone] Fax [phone]

## 2018-03-31 LAB — COMPREHENSIVE METABOLIC PANEL
ALT: 43 IU/L — ABNORMAL HIGH (ref 0–32)
AST: 30 IU/L (ref 0–40)
Albumin/Globulin Ratio: 1.4 (ref 1.2–2.2)
Albumin: 4.6 g/dL (ref 3.5–5.5)
Alkaline Phosphatase: 74 IU/L (ref 39–117)
BUN/Creatinine Ratio: 22 (ref 9–23)
BUN: 16 mg/dL (ref 6–24)
Bilirubin Total: 0.5 mg/dL (ref 0.0–1.2)
CO2: 24 mmol/L (ref 20–29)
Calcium: 9.3 mg/dL (ref 8.7–10.2)
Chloride: 101 mmol/L (ref 96–106)
Creatinine, Ser: 0.73 mg/dL (ref 0.57–1.00)
GFR calc Af Amer: 116 mL/min/{1.73_m2} (ref >59)
GFR calc non Af Amer: 100 mL/min/{1.73_m2} (ref >59)
Globulin, Total: 3.2 g/dL (ref 1.5–4.5)
Glucose: 96 mg/dL (ref 65–99)
Potassium: 3.7 mmol/L (ref 3.5–5.2)
Sodium: 138 mmol/L (ref 134–144)
Total Protein: 7.8 g/dL (ref 6.0–8.5)

## 2018-03-31 LAB — URINALYSIS, DIPSTICK ONLY
Bilirubin, UA: NEGATIVE
Glucose, UA: NEGATIVE
Ketones, UA: NEGATIVE
Leukocytes, UA: NEGATIVE
Nitrite, UA: NEGATIVE
RBC, UA: NEGATIVE
Specific Gravity, UA: >=1.03 — AB (ref 1.005–1.030)
Urobilinogen, Ur: 0.2 mg/dL (ref 0.2–1.0)
pH, UA: 5.5 (ref 5.0–7.5)

## 2018-03-31 LAB — TSH: TSH: 2.18 u[IU]/mL (ref 0.450–4.500)

## 2018-04-08 NOTE — Progress Notes (Signed)
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   History of Present Illness: Tanya Cruz is a 42 y.o. female seen in consultation per request of her PCP.  According to patient her symptoms started about 6 months ago with increased discomfort in her hands and wrist joints.  She states her hands and wrist joints will swell.  Gradually the pain moved to her forearms elbows and shoulders.  She states she feels heaviness in her upper extremities.  She has been also experiencing pain in her hip joints, knee joints, ankles and feet.  She denies any joint swelling.  She states she has pain in her entire back.  She also gives history of extreme fatigue.  Activities of Daily Living:  Patient reports morning stiffness for all day hours.   Patient Reports nocturnal pain.  Difficulty dressing/grooming: Reports Difficulty climbing stairs: Reports Difficulty getting out of chair: Reports Difficulty using hands for taps, buttons, cutlery, and/or writing: Reports  Review of Systems  Constitutional: Positive for fatigue. Negative for night sweats, weight gain and weight loss.  HENT: Positive for mouth dryness. Negative for mouth sores, trouble swallowing, trouble swallowing and nose dryness.   Eyes: Positive for dryness. Negative for pain, redness and visual disturbance.  Respiratory: Negative for cough, shortness of breath and difficulty breathing.   Cardiovascular: Negative for chest pain, palpitations, hypertension, irregular heartbeat and swelling in legs/feet.  Gastrointestinal: Negative for blood in stool, constipation and diarrhea.  Endocrine: Negative for increased urination.  Genitourinary: Negative for vaginal  dryness.  Musculoskeletal: Positive for arthralgias, joint pain, myalgias, morning stiffness and myalgias. Negative for joint swelling, muscle weakness and muscle tenderness.  Skin: Positive for sensitivity to sunlight. Negative for color change, rash, hair loss, skin tightness and ulcers.  Allergic/Immunologic: Negative for susceptible to infections.  Neurological: Negative for dizziness, memory loss, night sweats and weakness.  Hematological: Negative for swollen glands.  Psychiatric/Behavioral: Positive for depressed mood and sleep disturbance. The patient is not nervous/anxious.     PMFS History:  Patient Active Problem List   Diagnosis Date Noted  . Previous cesarean delivery, delivered, with or without mention of antepartum condition 04/07/2012  . Language barrier, cultural differences 04/04/2012    Past Medical History:  Diagnosis Date  . GERD (gastroesophageal reflux disease)     Family History  Problem Relation Age of Onset  . Hyperlipidemia Mother   . Diabetes Father   . Diabetes Paternal Grandmother   . Healthy Son   . Healthy Son   . Healthy Son   . Healthy Daughter    Past Surgical History:  Procedure Laterality Date  . CESAREAN SECTION     x2  . CESAREAN SECTION  04/04/2012   Procedure: CESAREAN SECTION;  Surgeon: Tereso NewcomerUgonna A Anyanwu, MD;  Location: [facility];  Service: Obstetrics;  Laterality: N/A;  . FOOT FRACTURE SURGERY     Social History   Social History Narrative  . Not on file    Objective: Vital Signs: BP 114/85 (BP Location: Right Arm, Patient Position: Sitting, Cuff Size: Normal)   Pulse 78   Resp 13  Ht 5' (1.524 m)   Wt 187 lb (84.8 kg)   BMI 36.52 kg/m    Physical Exam Vitals signs and nursing note reviewed.  Constitutional:      Appearance: She is well-developed.  HENT:     Head: Normocephalic and atraumatic.  Eyes:     Conjunctiva/sclera: Conjunctivae normal.  Neck:     Musculoskeletal: Normal range of motion.  Cardiovascular:      Rate and Rhythm: Normal rate and regular rhythm.     Heart sounds: Normal heart sounds.  Pulmonary:     Effort: Pulmonary effort is normal.     Breath sounds: Normal breath sounds.  Abdominal:     General: Bowel sounds are normal.     Palpations: Abdomen is soft.  Lymphadenopathy:     Cervical: No cervical adenopathy.  Skin:    General: Skin is warm and dry.     Capillary Refill: Capillary refill takes less than 2 seconds.     Comments: Mild erythema noted over the malar region.  Neurological:     Mental Status: She is alert and oriented to person, place, and time.  Psychiatric:        Behavior: Behavior normal.      Musculoskeletal Exam: C-spine thoracic lumbar spine good range of motion.  Shoulder joints elbow joints wrist joints MCPs PIPs DIPs been good range of motion with no synovitis.  Hip joints knee joints ankles MTPs PIPs with good range of motion with no synovitis.  CDAI Exam: CDAI Score: Not documented Patient Global Assessment: Not documented; Provider Global Assessment: Not documented Swollen: Not documented; Tender: Not documented Joint Exam   Not documented   There is currently no information documented on the homunculus. Go to the Rheumatology activity and complete the homunculus joint exam.  Investigation: Findings:  02/24/18: ANA 1:1280 speckled, RF 18.3, CCP 9, sed rate 37, CRP 3  Component     Latest Ref Rng & Units 02/24/2018  ANA Titer 1      Positive (A)  RA Latex Turbid.     0.0 - 13.9 IU/mL 18.3 (H)  Cyclic Citrullin Peptide Ab     0 - 19 units 9  Sed Rate     0 - 32 mm/hr 37 (H)  CRP     0 - 10 mg/L 3   Imaging: Dg Chest 2 View  Result Date: 03/30/2018 CLINICAL DATA:  Shortness of breath EXAM: CHEST - 2 VIEW COMPARISON:  None. FINDINGS: The heart size and mediastinal contours are within normal limits. Both lungs are clear. The visualized skeletal structures are unremarkable. IMPRESSION: No active cardiopulmonary disease. No evidence of  pneumonia pulmonary edema. Electronically Signed   By: Bary Richard M.D.   On: 03/30/2018 12:20   Xr Foot 2 Views Left  Result Date: 04/22/2018 No MTP PIP or DIP narrowing was noted.  No erosive changes were noted.  No intertarsal changes were noted. Impression: Unremarkable x-ray of the foot.  Xr Foot 2 Views Right  Result Date: 04/22/2018 No MTP PIP or DIP narrowing was noted.  No erosive changes were noted.  No intertarsal changes were noted. Impression: Unremarkable x-ray of the foot.  Xr Hand 2 View Left  Result Date: 04/22/2018 No MCP PIP DIP or intercarpal joint space narrowing was noted.  No erosive changes were noted. Impression: Unremarkable x-ray of the hand.  Xr Hand 2 View Right  Result Date: 04/22/2018 No MCP PIP DIP or intercarpal joint space narrowing was noted.  No  erosive changes were noted. Impression: Unremarkable x-ray of the hand.   Recent Labs: Lab Results  Component Value Date   WBC 6.7 03/30/2018   HGB 13.4 03/30/2018   PLT 288 08/20/2017   NA 138 03/30/2018   K 3.7 03/30/2018   CL 101 03/30/2018   CO2 24 03/30/2018   GLUCOSE 96 03/30/2018   BUN 16 03/30/2018   CREATININE 0.73 03/30/2018   BILITOT 0.5 03/30/2018   ALKPHOS 74 03/30/2018   AST 30 03/30/2018   ALT 43 (H) 03/30/2018   PROT 7.8 03/30/2018   ALBUMIN 4.6 03/30/2018   CALCIUM 9.3 03/30/2018   GFRAA 116 03/30/2018  August 20, 2017 HIV negative December 2019 TSH normal, UA normal  Speciality Comments: No specialty comments available.  Procedures:  No procedures performed Allergies: Patient has no known allergies.   Assessment / Plan:     Visit Diagnoses: Polyarthralgia-patient complains of pain in almost all of her joints.  She also complains of pain in her muscles.  She has been experiencing increased fatigue.  She describes pain in her shoulders, elbows, wrists, hands, hips, knees, ankles, MTPs and PIPs.  She also has pain in her entire lower back.  Pain in both hands -she  gives history of intermittent tingling in her upper extremities and ongoing pain and discomfort in her hands.  I did not see any synovitis on examination.  Plan: XR Hand 2 View Right, XR Hand 2 View Left.  X-ray of bilateral hands were unremarkable.  Pain in both feet -she complains of a lot of discomfort in her bilateral feet.  No warmth swelling or effusion was noted.  Plan: XR Foot 2 Views Right, XR Foot 2 Views Left.  The x-ray of bilateral feet were unremarkable.  Positive ANA (antinuclear antibody) - 02/24/18: ANA 1:1280 speckled, RF 18.3, CCP 9, sed rate 37, CRP 3 -she has significantly high ANA titer.  I will obtain following labs to evaluate this further.  She has mild malar erythema.  Plan: Anti-scleroderma antibody, RNP Antibody, Anti-Smith antibody, Sjogrens syndrome-A extractable nuclear antibody, Sjogrens syndrome-B extractable nuclear antibody, Anti-DNA antibody, double-stranded, C3 and C4, Beta-2 glycoprotein antibodies, Cardiolipin antibodies, IgG, IgM, IgA, Lupus Anticoagulant Eval w/Reflex  Other fatigue -she complains of increased fatigue going on for the last 6 months now.  Plan: CK, VITAMIN D 25 Hydroxy (Vit-D Deficiency, Fractures), Hepatitis B core antibody, IgM, Hepatitis B surface antigen, Hepatitis C antibody, QuantiFERON-TB Gold Plus, Serum protein electrophoresis with reflex, IgG, IgA, IgM, Glucose 6 phosphate dehydrogenase  History of gastroesophageal reflux (GERD)  Language barrier, cultural differences -interpreted by her husband and an interpreter.  Orders: Orders Placed This Encounter  Procedures  . XR Hand 2 View Right  . XR Hand 2 View Left  . XR Foot 2 Views Right  . XR Foot 2 Views Left  . CK  . VITAMIN D 25 Hydroxy (Vit-D Deficiency, Fractures)  . Anti-scleroderma antibody  . RNP Antibody  . Anti-Smith antibody  . Sjogrens syndrome-A extractable nuclear antibody  . Sjogrens syndrome-B extractable nuclear antibody  . Anti-DNA antibody, double-stranded    . C3 and C4  . Beta-2 glycoprotein antibodies  . Cardiolipin antibodies, IgG, IgM, IgA  . Lupus Anticoagulant Eval w/Reflex  . Hepatitis B core antibody, IgM  . Hepatitis B surface antigen  . Hepatitis C antibody  . QuantiFERON-TB Gold Plus  . Serum protein electrophoresis with reflex  . IgG, IgA, IgM  . Glucose 6 phosphate dehydrogenase   No orders of the  defined types were placed in this encounter.   Face-to-face time spent with patient was 50 minutes. Greater than 50% of time was spent in counseling and coordination of care.  Follow-Up Instructions: Return for Positive ANA, polyarthralgia.   [name], MD  Note - This record has been created using Animal nutritionist.  Chart creation errors have been sought, but may not always  have been located. Such creation errors do not reflect on  the standard of medical care.

## 2018-04-14 ENCOUNTER — Encounter: Payer: Self-pay | Admitting: *Deleted

## 2018-04-22 ENCOUNTER — Ambulatory Visit (INDEPENDENT_AMBULATORY_CARE_PROVIDER_SITE_OTHER): Payer: Self-pay

## 2018-04-22 ENCOUNTER — Ambulatory Visit (INDEPENDENT_AMBULATORY_CARE_PROVIDER_SITE_OTHER): Payer: BLUE CROSS/BLUE SHIELD | Admitting: Rheumatology

## 2018-04-22 ENCOUNTER — Encounter: Payer: Self-pay | Admitting: Rheumatology

## 2018-04-22 VITALS — BP 114/85 | HR 78 | Resp 13 | Ht 60.0 in | Wt 187.0 lb

## 2018-04-22 DIAGNOSIS — M2550 Pain in unspecified joint: Secondary | ICD-10-CM | POA: Diagnosis not present

## 2018-04-22 DIAGNOSIS — Z8719 Personal history of other diseases of the digestive system: Secondary | ICD-10-CM

## 2018-04-22 DIAGNOSIS — M79671 Pain in right foot: Secondary | ICD-10-CM

## 2018-04-22 DIAGNOSIS — M79642 Pain in left hand: Secondary | ICD-10-CM | POA: Diagnosis not present

## 2018-04-22 DIAGNOSIS — R5383 Other fatigue: Secondary | ICD-10-CM

## 2018-04-22 DIAGNOSIS — R768 Other specified abnormal immunological findings in serum: Secondary | ICD-10-CM | POA: Diagnosis not present

## 2018-04-22 DIAGNOSIS — M79641 Pain in right hand: Secondary | ICD-10-CM

## 2018-04-22 DIAGNOSIS — M79672 Pain in left foot: Secondary | ICD-10-CM | POA: Diagnosis not present

## 2018-04-22 DIAGNOSIS — Z603 Acculturation difficulty: Secondary | ICD-10-CM

## 2018-04-26 LAB — CARDIOLIPIN ANTIBODIES, IGG, IGM, IGA
Anticardiolipin IgA: <11 [APL'U]
Anticardiolipin IgG: <14 [GPL'U]
Anticardiolipin IgM: <12 [MPL'U]

## 2018-04-26 LAB — IGG, IGA, IGM
IgG (Immunoglobin G), Serum: 1904 mg/dL — ABNORMAL HIGH (ref 600–1640)
IgM, Serum: 80 mg/dL (ref 50–300)
Immunoglobulin A: 281 mg/dL (ref 47–310)

## 2018-04-26 LAB — PROTEIN ELECTROPHORESIS, SERUM, WITH REFLEX
Albumin ELP: 4.1 g/dL (ref 3.8–4.8)
Alpha 1: 0.3 g/dL (ref 0.2–0.3)
Alpha 2: 0.6 g/dL (ref 0.5–0.9)
Beta 2: 0.4 g/dL (ref 0.2–0.5)
Beta Globulin: 0.5 g/dL (ref 0.4–0.6)
Gamma Globulin: 1.7 g/dL (ref 0.8–1.7)
Total Protein: 7.6 g/dL (ref 6.1–8.1)

## 2018-04-26 LAB — GLUCOSE 6 PHOSPHATE DEHYDROGENASE: G-6PDH: 19.7 U/g Hgb (ref 7.0–20.5)

## 2018-04-26 LAB — BETA-2 GLYCOPROTEIN ANTIBODIES
Beta-2 Glyco 1 IgA: <9 SAU (ref <=20)
Beta-2 Glyco 1 IgM: <9 SMU (ref <=20)
Beta-2 Glyco I IgG: 14 SGU (ref <=20)

## 2018-04-26 LAB — ANTI-SMITH ANTIBODY: ENA SM Ab Ser-aCnc: <1 AI

## 2018-04-26 LAB — QUANTIFERON-TB GOLD PLUS
MITOGEN-NIL: 7.5 [IU]/mL
NIL: 0.13 IU/mL
QuantiFERON-TB Gold Plus: POSITIVE — AB
TB1-NIL: 0.13 IU/mL
TB2-NIL: 0.36 IU/mL

## 2018-04-26 LAB — ANTI-DNA ANTIBODY, DOUBLE-STRANDED: ds DNA Ab: 2 IU/mL

## 2018-04-26 LAB — LUPUS ANTICOAGULANT EVAL W/ REFLEX
DRVVT: 39 s (ref <=45)
PTT-LA Screen: 34 s (ref <=40)

## 2018-04-26 LAB — HEPATITIS C ANTIBODY
HEP C AB: NONREACTIVE
SIGNAL TO CUT-OFF: 0.03 (ref <1.00)

## 2018-04-26 LAB — SJOGRENS SYNDROME-B EXTRACTABLE NUCLEAR ANTIBODY: SSB (La) (ENA) Antibody, IgG: <1 AI

## 2018-04-26 LAB — ANTI-SCLERODERMA ANTIBODY: SCLERODERMA (SCL-70) (ENA) ANTIBODY, IGG: NEGATIVE AI

## 2018-04-26 LAB — HEPATITIS B CORE ANTIBODY, IGM: Hep B C IgM: NONREACTIVE

## 2018-04-26 LAB — RNP ANTIBODY: Ribonucleic Protein(ENA) Antibody, IgG: <1 AI

## 2018-04-26 LAB — CK: CK TOTAL: 63 U/L (ref 29–143)

## 2018-04-26 LAB — C3 AND C4
C3 Complement: 142 mg/dL (ref 83–193)
C4 Complement: 34 mg/dL (ref 15–57)

## 2018-04-26 LAB — VITAMIN D 25 HYDROXY (VIT D DEFICIENCY, FRACTURES): Vit D, 25-Hydroxy: 32 ng/mL (ref 30–100)

## 2018-04-26 LAB — HEPATITIS B SURFACE ANTIGEN: Hepatitis B Surface Ag: NONREACTIVE

## 2018-04-26 LAB — SJOGRENS SYNDROME-A EXTRACTABLE NUCLEAR ANTIBODY: SSA (Ro) (ENA) Antibody, IgG: >8 AI — AB

## 2018-05-03 ENCOUNTER — Telehealth: Payer: Self-pay | Admitting: *Deleted

## 2018-05-03 DIAGNOSIS — R7612 Nonspecific reaction to cell mediated immunity measurement of gamma interferon antigen response without active tuberculosis: Secondary | ICD-10-CM

## 2018-05-03 NOTE — Telephone Encounter (Signed)
Referral placed to Infectious Disease per Dr. Corliss Skains for positive Tb Gold.

## 2018-05-06 DIAGNOSIS — Z8719 Personal history of other diseases of the digestive system: Secondary | ICD-10-CM | POA: Insufficient documentation

## 2018-05-06 DIAGNOSIS — R7612 Nonspecific reaction to cell mediated immunity measurement of gamma interferon antigen response without active tuberculosis: Secondary | ICD-10-CM | POA: Insufficient documentation

## 2018-05-06 NOTE — Progress Notes (Deleted)
Office Visit Note  Patient: Tanya Cruz             Date of Birth: [birth year]           MRN: [id number]             PCP: Myles Lipps, MD Referring: Myles Lipps, MD Visit Date: 05/17/2018 Occupation: @GUAROCC @  Subjective:  No chief complaint on file.   History of Present Illness: Tanya Cruz is a 43 y.o. female ***   Activities of Daily Living:  Patient reports morning stiffness for *** {minute/hour:19697}.   Patient {ACTIONS;DENIES/REPORTS:21021675::"Denies"} nocturnal pain.  Difficulty dressing/grooming: {ACTIONS;DENIES/REPORTS:21021675::"Denies"} Difficulty climbing stairs: {ACTIONS;DENIES/REPORTS:21021675::"Denies"} Difficulty getting out of chair: {ACTIONS;DENIES/REPORTS:21021675::"Denies"} Difficulty using hands for taps, buttons, cutlery, and/or writing: {ACTIONS;DENIES/REPORTS:21021675::"Denies"}  No Rheumatology ROS completed.   PMFS History:  Patient Active Problem List   Diagnosis Date Noted  . Previous cesarean delivery, delivered, with or without mention of antepartum condition 04/07/2012  . Language barrier, cultural differences 04/04/2012    Past Medical History:  Diagnosis Date  . GERD (gastroesophageal reflux disease)     Family History  Problem Relation Age of Onset  . Hyperlipidemia Mother   . Diabetes Father   . Diabetes Paternal Grandmother   . Healthy Son   . Healthy Son   . Healthy Son   . Healthy Daughter    Past Surgical History:  Procedure Laterality Date  . CESAREAN SECTION     x2  . CESAREAN SECTION  04/04/2012   Procedure: CESAREAN SECTION;  Surgeon: Tereso Newcomer, MD;  Location: [facility];  Service: Obstetrics;  Laterality: N/A;  . FOOT FRACTURE SURGERY     Social History   Social History Narrative  . Not on file   Immunization History  Administered Date(s) Administered  . Tdap 04/05/2012     Objective: Vital Signs: There were no vitals taken for this visit.   Physical Exam    Musculoskeletal Exam: ***  CDAI Exam: CDAI Score: Not documented Patient Global Assessment: Not documented; Provider Global Assessment: Not documented Swollen: Not documented; Tender: Not documented Joint Exam   Not documented   There is currently no information documented on the homunculus. Go to the Rheumatology activity and complete the homunculus joint exam.  Investigation: No additional findings.  Imaging: Xr Foot 2 Views Left  Result Date: 04/22/2018 No MTP PIP or DIP narrowing was noted.  No erosive changes were noted.  No intertarsal changes were noted. Impression: Unremarkable x-ray of the foot.  Xr Foot 2 Views Right  Result Date: 04/22/2018 No MTP PIP or DIP narrowing was noted.  No erosive changes were noted.  No intertarsal changes were noted. Impression: Unremarkable x-ray of the foot.  Xr Hand 2 View Left  Result Date: 04/22/2018 No MCP PIP DIP or intercarpal joint space narrowing was noted.  No erosive changes were noted. Impression: Unremarkable x-ray of the hand.  Xr Hand 2 View Right  Result Date: 04/22/2018 No MCP PIP DIP or intercarpal joint space narrowing was noted.  No erosive changes were noted. Impression: Unremarkable x-ray of the hand.   Recent Labs: Lab Results  Component Value Date   WBC 6.7 03/30/2018   HGB 13.4 03/30/2018   PLT 288 08/20/2017   NA 138 03/30/2018   K 3.7 03/30/2018   CL 101 03/30/2018   CO2 24 03/30/2018   GLUCOSE 96 03/30/2018   BUN 16 03/30/2018   CREATININE 0.73 03/30/2018   BILITOT 0.5 03/30/2018   ALKPHOS 74 03/30/2018  AST 30 03/30/2018   ALT 43 (H) 03/30/2018   PROT 7.6 04/22/2018   ALBUMIN 4.6 03/30/2018   CALCIUM 9.3 03/30/2018   GFRAA 116 03/30/2018   QFTBGOLDPLUS POSITIVE (A) 04/22/2018  UA negative, CK 63, SPEP negative, vitamin D 32, TB Gold positive, immunoglobulin IgG elevated, hepatitis C-, hepatitis B- Lupus anticoagulant negative, beta-2 negative, anticardiolipin negative, C3-C4 normal,  ANA 1: 1280, SSA positive (SSB negative, Smith negative, RNP negative, SCL 70-, dsDNA negative)  February 24, 2018 RF 18.3+, anti-CCP negative  Speciality Comments: No specialty comments available.  Procedures:  No procedures performed Allergies: Patient has no known allergies.   Assessment / Plan:     Visit Diagnoses: Positive ANA (antinuclear antibody) - ANA 1: 1280 speckled, positive SSA, positive RF  Other fatigue  Polyarthralgia  Language barrier, cultural differences  History of gastroesophageal reflux (GERD)  Positive QuantiFERON-TB Gold test   Orders: No orders of the defined types were placed in this encounter.  No orders of the defined types were placed in this encounter.   Face-to-face time spent with patient was *** minutes. Greater than 50% of time was spent in counseling and coordination of care.  Follow-Up Instructions: No follow-ups on file.   [name], MD  Note - This record has been created using Animal nutritionist.  Chart creation errors have been sought, but may not always  have been located. Such creation errors do not reflect on  the standard of medical care.

## 2018-05-17 ENCOUNTER — Ambulatory Visit: Payer: BLUE CROSS/BLUE SHIELD | Admitting: Rheumatology

## 2018-05-19 ENCOUNTER — Ambulatory Visit: Payer: Self-pay | Admitting: Family

## 2018-06-06 ENCOUNTER — Encounter: Payer: Self-pay | Admitting: Internal Medicine

## 2018-06-06 ENCOUNTER — Ambulatory Visit (INDEPENDENT_AMBULATORY_CARE_PROVIDER_SITE_OTHER): Payer: BLUE CROSS/BLUE SHIELD | Admitting: Internal Medicine

## 2018-06-06 ENCOUNTER — Ambulatory Visit
Admission: RE | Admit: 2018-06-06 | Discharge: 2018-06-06 | Disposition: A | Discharge status: Home / Self Care | Payer: BLUE CROSS/BLUE SHIELD | Source: Ambulatory Visit | Attending: Internal Medicine | Admitting: Internal Medicine

## 2018-06-06 VITALS — BP 120/84 | HR 86 | Temp 98.0°F | Wt 191.0 lb

## 2018-06-06 DIAGNOSIS — R7612 Nonspecific reaction to cell mediated immunity measurement of gamma interferon antigen response without active tuberculosis: Secondary | ICD-10-CM

## 2018-06-06 DIAGNOSIS — R7611 Nonspecific reaction to tuberculin skin test without active tuberculosis: Secondary | ICD-10-CM | POA: Diagnosis not present

## 2018-06-06 MED ORDER — RIFAMPIN 300 MG PO CAPS: 600.0000 mg | ORAL_CAPSULE | Freq: Every day | ORAL | 3 refills | Status: DC

## 2018-06-06 NOTE — Progress Notes (Signed)
[facility]      Reason for Consult: latent Tb    Referring Physician: Dr. Corliss Skains    Patient ID: Tanya Cruz, female    DOB: [birth year], 43 y.o.   MRN: [id number]  HPI:   Here for evaluation of above.  Went to her PCP and evaluated for arthralgias and myalgias and sent to rheumatology.  Underwent evaluation and was Quantiferon Gold positive.  At this time, her symptoms have improved.  She did start taking an anti-inflammatory prescribed by her physician however she felt that that made her feel worse and stopped.  She is taking nothing now.  She does still have some occasional issues with myalgias and arthralgias but has not noted any swelling.  No fever.  No fever. She has had no exposures to tuberculosis from her knowledge, she did grow up in [place].  She has had a mild cough and some vague feelings of losing her breath momentarily.  No weight loss.  Has not been in jail. Previous record reviewed in Epic and summarized above.   Past Medical History:  Diagnosis Date  . GERD (gastroesophageal reflux disease)     Prior to Admission medications   Medication Sig Start Date End Date Taking? Authorizing Provider  Multiple Vitamin (MULTIVITAMIN) tablet Take 1 tablet by mouth daily.   Yes [provider]  meloxicam (MOBIC) 7.5 MG tablet Take 1 tablet (7.5 mg total) by mouth daily. Patient not taking: Reported on 03/30/2018 02/24/18   Myles Lipps, MD  naphazoline-pheniramine (NAPHCON-A) 0.025-0.3 % ophthalmic solution Place 2 drops into both eyes 4 (four) times daily as needed for eye irritation. Patient not taking: Reported on 03/30/2018 01/05/18   Benjiman Core D, PA-C  omeprazole (PRILOSEC) 20 MG capsule Take 1 capsule (20 mg total) by mouth 2 (two) times daily before a meal. Patient not taking: Reported on 04/22/2018 03/30/18   Myles Lipps, MD  polyethylene glycol powder (GLYCOLAX/MIRALAX) powder Take 17 g by mouth  daily. Patient not taking: Reported on 06/06/2018 08/20/17   Myles Lipps, MD  rifampin (RIFADIN) 300 MG capsule Take 2 capsules (600 mg total) by mouth daily. 06/06/18   Marvena Tally, Belia Heman, MD    No Known Allergies  Social History   Tobacco Use  . Smoking status: Never Smoker  . Smokeless tobacco: Never Used  Substance Use Topics  . Alcohol use: Never    Frequency: Never  . Drug use: Never    Family History  Problem Relation Age of Onset  . Hyperlipidemia Mother   . Diabetes Father   . Diabetes Paternal Grandmother   . Healthy Son   . Healthy Son   . Healthy Son   . Healthy Daughter     Review of Systems  Constitutional: negative for fevers and chills; positive for fatigue Respiratory: positive for cough, negative for sputum, hemoptysis or dyspnea on exertion Gastrointestinal: negative for nausea and diarrhea Integument/breast: negative for rash Musculoskeletal: positive for myalgias and bone pain, negative for stiff joints All other systems reviewed and are negative    Constitutional: in no apparent distress  Vitals:   06/06/18 1512  BP: 120/84  Pulse: 86  Temp: 98 F (36.7 C)   EYES: anicteric ENMT:no thrush Cardiovascular: Cor RRR Respiratory: CTA B; normal respiratory effort GI: Bowel sounds are normal Musculoskeletal: no pedal edema noted Skin: negatives: no rash Neuro: non-focal  Labs: Lab Results  Component Value Date   WBC 6.7 03/30/2018  HGB 13.4 03/30/2018   HCT 39.2 03/30/2018   MCV 79.1 03/30/2018   PLT 288 08/20/2017    Lab Results  Component Value Date   CREATININE 0.73 03/30/2018   BUN 16 03/30/2018   NA 138 03/30/2018   K 3.7 03/30/2018   CL 101 03/30/2018   CO2 24 03/30/2018    Lab Results  Component Value Date   ALT 43 (H) 03/30/2018   AST 30 03/30/2018   ALKPHOS 74 03/30/2018   BILITOT 0.5 03/30/2018     Assessment: latent Tb.  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  If CXR negative, will use rifampin since she is not on any  medication now now no obvious plans for biologics at this time.    Plan: 1) HIV, CMP, CBC 2) CXR 3) if above negative, Rifampin 600 mg daily for 4 months Encourage to return to her rheumatologist.  RTC 5 weeks

## 2018-06-07 ENCOUNTER — Telehealth: Payer: Self-pay | Admitting: Behavioral Health

## 2018-06-07 LAB — CBC
HEMATOCRIT: 42.6 % (ref 35.0–45.0)
Hemoglobin: 14.1 g/dL (ref 11.7–15.5)
MCH: 27.8 pg (ref 27.0–33.0)
MCHC: 33.1 g/dL (ref 32.0–36.0)
MCV: 84 fL (ref 80.0–100.0)
MPV: 10.1 fL (ref 7.5–12.5)
Platelets: 249 10*3/uL (ref 140–400)
RBC: 5.07 10*6/uL (ref 3.80–5.10)
RDW: 14.6 % (ref 11.0–15.0)
WBC: 7 10*3/uL (ref 3.8–10.8)

## 2018-06-07 LAB — COMPREHENSIVE METABOLIC PANEL
AG Ratio: 1.1 (calc) (ref 1.0–2.5)
ALT: 50 U/L — ABNORMAL HIGH (ref 6–29)
AST: 29 U/L (ref 10–35)
Albumin: 4.2 g/dL (ref 3.6–5.1)
Alkaline phosphatase (APISO): 88 U/L (ref 31–125)
BUN/Creatinine Ratio: 44 (calc) — ABNORMAL HIGH (ref 6–22)
BUN: 26 mg/dL — ABNORMAL HIGH (ref 7–25)
CO2: 23 mmol/L (ref 20–32)
Calcium: 9.1 mg/dL (ref 8.6–10.2)
Chloride: 104 mmol/L (ref 98–110)
Creat: 0.59 mg/dL (ref 0.50–1.10)
Globulin: 3.7 g/dL (calc) (ref 1.9–3.7)
Glucose, Bld: 154 mg/dL — ABNORMAL HIGH (ref 65–99)
Potassium: 4.1 mmol/L (ref 3.5–5.3)
Sodium: 137 mmol/L (ref 135–146)
TOTAL PROTEIN: 7.9 g/dL (ref 6.1–8.1)
Total Bilirubin: 0.4 mg/dL (ref 0.2–1.2)

## 2018-06-07 LAB — HIV ANTIBODY (ROUTINE TESTING W REFLEX): HIV 1&2 Ab, 4th Generation: NONREACTIVE

## 2018-06-07 NOTE — Telephone Encounter (Signed)
Ok, thanks.  Side effects of the medication would not happen that fast and those symptoms would not be related to it.   Probably the best option is to wait for her follow up visit with her rheumatologist to see if a biologic medication is anticipated and we can readdress it at that time.  From what I see, it is unlikely she will need treatment.  So have her stop, cancel her follow up with me and she should follow up with her rheumatologist.   Thanks

## 2018-06-07 NOTE — Telephone Encounter (Signed)
Returned patient's call via [employer] [phone](Danny #[id number]).  Patient states she started taking Rifampin yesterday 06/06/2018.  She states after she took it she felt burning in her chest, hands are burning, and tired.  Patient states she did not take the medication today because she does not think she will be able to continue if theses are the symptoms.  She also complained of red/orange urine.  RN explained this was a normal symptom of the medication.  Informed patient Dr. Luciana Axeomer would be made aware and will call her with an update. Angeline SlimAshley Dula Havlik RN

## 2018-06-07 NOTE — Telephone Encounter (Signed)
Called patient back using [employer] Byrd Hesselbach [phone]).  Informed patient per Dr. Luciana Axe that side effects of the medication would not happen that fast and those symptoms do not appear to be related to the medication.   Informed her Dr. Luciana Axe suggested she follow up with her rheumatologist to see if a biologic medication anticipated and can readdress at that time.  She can stop her Rifampin, and cancel her follow up with Dr. Luciana Axe.  Patient verbalized understanding and states she will follow up with the Rheumatologist.   Angeline Slim RN

## 2018-06-08 ENCOUNTER — Encounter (HOSPITAL_COMMUNITY): Payer: Self-pay | Admitting: Emergency Medicine

## 2018-06-08 ENCOUNTER — Other Ambulatory Visit: Payer: Self-pay

## 2018-06-08 ENCOUNTER — Ambulatory Visit (HOSPITAL_COMMUNITY)
Admission: EM | Admit: 2018-06-08 | Discharge: 2018-06-08 | Disposition: A | Discharge status: Home / Self Care | Payer: BLUE CROSS/BLUE SHIELD | Attending: Internal Medicine | Admitting: Internal Medicine

## 2018-06-08 DIAGNOSIS — R7612 Nonspecific reaction to cell mediated immunity measurement of gamma interferon antigen response without active tuberculosis: Secondary | ICD-10-CM

## 2018-06-08 DIAGNOSIS — K219 Gastro-esophageal reflux disease without esophagitis: Secondary | ICD-10-CM | POA: Diagnosis not present

## 2018-06-08 DIAGNOSIS — R1032 Left lower quadrant pain: Secondary | ICD-10-CM

## 2018-06-08 DIAGNOSIS — Z3202 Encounter for pregnancy test, result negative: Secondary | ICD-10-CM

## 2018-06-08 HISTORY — DX: Respiratory tuberculosis unspecified: A15.9

## 2018-06-08 LAB — POCT URINALYSIS DIP (DEVICE)
Bilirubin Urine: NEGATIVE
Glucose, UA: NEGATIVE mg/dL
Hgb urine dipstick: NEGATIVE
Ketones, ur: NEGATIVE mg/dL
Leukocytes,Ua: NEGATIVE
Nitrite: NEGATIVE
PROTEIN: NEGATIVE mg/dL
Specific Gravity, Urine: 1.025 (ref 1.005–1.030)
UROBILINOGEN UA: 0.2 mg/dL (ref 0.0–1.0)
pH: 5.5 (ref 5.0–8.0)

## 2018-06-08 LAB — POCT PREGNANCY, URINE: Preg Test, Ur: NEGATIVE

## 2018-06-08 MED ORDER — ALUM & MAG HYDROXIDE-SIMETH 200-200-20 MG/5ML PO SUSP
ORAL | Status: AC
  Filled 2018-06-08: qty 30

## 2018-06-08 MED ORDER — POLYETHYLENE GLYCOL 3350 17 GM/SCOOP PO POWD: 17.0000 g | Freq: Once | ORAL | 0 refills | Status: DC

## 2018-06-08 MED ORDER — LIDOCAINE VISCOUS HCL 2 % MT SOLN
15.0000 mL | Freq: Once | OROMUCOSAL | Status: AC
  Administered 2018-06-08: 15 mL via ORAL

## 2018-06-08 MED ORDER — ALUM & MAG HYDROXIDE-SIMETH 200-200-20 MG/5ML PO SUSP
30.0000 mL | Freq: Once | ORAL | Status: AC
  Administered 2018-06-08: 30 mL via ORAL

## 2018-06-08 MED ORDER — POLYETHYLENE GLYCOL 3350 17 GM/SCOOP PO POWD: 17.0000 g | Freq: Once | ORAL | 0 refills | Status: AC

## 2018-06-08 MED ORDER — OMEPRAZOLE 20 MG PO CPDR: 20.0000 mg | DELAYED_RELEASE_CAPSULE | Freq: Every day | ORAL | 0 refills | Status: DC

## 2018-06-08 MED ORDER — LIDOCAINE VISCOUS HCL 2 % MT SOLN
OROMUCOSAL | Status: AC
  Filled 2018-06-08: qty 15

## 2018-06-08 MED ORDER — DOCUSATE SODIUM 50 MG PO CAPS: 50.0000 mg | ORAL_CAPSULE | Freq: Two times a day (BID) | ORAL | 0 refills | Status: DC

## 2018-06-08 NOTE — Discharge Instructions (Signed)
Empiece omeprazole 20 mg diaria para el proximo 2 semanas [place] Miralax en liquido 1 vez cada dia  Colace 2 veces cada dia Puede ir a [facility] para el examen de TB  Please use Miralax for moderate to severe constipation. Take this once a day for the next 2-3 days. Please also start docusate stool softener, twice a day for at least 1 week. If stools become loose, cut down to once a day for another week. If stools remain loose, cut back to 1 pill every other day for a third week. You can stop docusate thereafter and resume as needed for constipation.  To help reduce constipation and promote bowel health: 1. Drink at least 64 ounces of water each day 2. Eat plenty of fiber (fruits, vegetables, whole grains, legumes) 3. Be physically active or exercise including walking, jogging, swimming, yoga, etc. 4. For active constipation use a stool softener (docusate) or an osmotic laxative (like Miralax) each day, or as needed.

## 2018-06-08 NOTE — ED Triage Notes (Signed)
Abdominal pain started yesterday.  Denies vomiting, denies diarrhea.  Last bm was last night, but required a lot of straining.  Denies pain with urination

## 2018-06-08 NOTE — ED Provider Notes (Signed)
[facility]    CSN: [id number] Arrival date & time: 06/08/18  1059     History   Chief Complaint Chief Complaint  Patient presents with  . Abdominal Pain    HPI Spanish interpreter via [employer] interpreter Tanya Cruz is a 43 y.o. female presenting today for evaluation of abdominal pain.  Patient states that over the past 3 months she has had discomfort in her left lower abdomen.  She states that she has a burning sensation.  She notices this mainly when she has to strain a lot when using the bathroom.  States that she has a tendency towards constipation.  Pain comes and goes along with the constipation.  She notes that she has not tried any over-the-counter medicines for this, but does drink a green smoothie daily that has cucumber, aloe vera celery, and other green products.  She notes that when she drinks this smoothly she typically has daily bowel movements but but still often has to strain when she does not she does not have a bowel movement.  Denies nausea or vomiting.  She also notes that over the past few months as well she has had a lot of arthralgias in her wrist, elbows, shoulders as well as joints of the lower extremities.  She will occasionally have a burning sensation in her palms and soles.  She is not taking anything for this.  Through chart review it appears she has been to rheumatology and been evaluated for rheumatologic disorders.  She did have a positive QuantiFERON and referred to ID.  She recently was seen by them 2 days ago, had a negative chest x-ray.  She was started on rifampin to start.  She took 1 dose of this and began to have a burning sensation in her chest and throat.  She contacted [facility] and they recommended for her to follow-up with rheumatology to further address symptoms and possibly start a biologic medicine.  Patient states that she is also here to have further evaluation of "tests.".  She is unable to state directly which test, but  would like to have a repeat TB test done.   HPI  Past Medical History:  Diagnosis Date  . GERD (gastroesophageal reflux disease)   . Tuberculosis     Patient Active Problem List   Diagnosis Date Noted  . 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 History of gastroesophageal reflux (GERD) 05/06/2018  . Previous cesarean delivery, delivered, with or without mention of antepartum condition 04/07/2012  . Language barrier, cultural differences 04/04/2012    Past Surgical History:  Procedure Laterality Date  . CESAREAN SECTION     x2  . CESAREAN SECTION  04/04/2012   Procedure: CESAREAN SECTION;  Surgeon: Tereso Newcomer, MD;  Location: [facility];  Service: Obstetrics;  Laterality: N/A;  . FOOT FRACTURE SURGERY      OB History    Gravida  4   Para  4   Term  1   Preterm      AB      Living  3     SAB      TAB      Ectopic      Multiple      Live Births               Home Medications    Prior to Admission medications   Medication Sig Start Date End Date Taking? Authorizing Provider  docusate sodium (COLACE) 50 MG capsule Take 1 capsule (  50 mg total) by mouth 2 (two) times daily. 06/08/18   ,  C, PA-C  Multiple Vitamin (MULTIVITAMIN) tablet Take 1 tablet by mouth daily.    [provider]  omeprazole (PRILOSEC) 20 MG capsule Take 1 capsule (20 mg total) by mouth daily. 06/08/18   ,  C, PA-C  rifampin (RIFADIN) 300 MG capsule Take 2 capsules (600 mg total) by mouth daily. 06/06/18   Gardiner Barefoot, MD    Family History Family History  Problem Relation Age of Onset  . Hyperlipidemia Mother   . Diabetes Father   . Diabetes Paternal Grandmother   . Healthy Son   . Healthy Son   . Healthy Son   . Healthy Daughter     Social History Social History   Tobacco Use  . Smoking status: Never Smoker  . Smokeless tobacco: Never Used  Substance Use Topics  . Alcohol use: Never    Frequency: Never  . Drug use: Never       Allergies   Patient has no known allergies.   Review of Systems Review of Systems  Constitutional: Positive for fatigue. Negative for fever.  HENT: Positive for sore throat.   Respiratory: Negative for shortness of breath.   Cardiovascular: Positive for chest pain.  Gastrointestinal: Positive for abdominal pain and constipation. Negative for blood in stool, diarrhea, nausea and vomiting.  Genitourinary: Negative for dysuria, flank pain, genital sores, hematuria, menstrual problem, vaginal bleeding, vaginal discharge and vaginal pain.  Musculoskeletal: Positive for arthralgias and myalgias. Negative for back pain.  Skin: Negative for rash.  Neurological: Negative for dizziness, light-headedness and headaches.     Physical Exam Triage Vital Signs ED Triage Vitals  Enc Vitals Group     BP 06/08/18 1229 108/71     Pulse Rate 06/08/18 1229 73     Resp 06/08/18 1229 16     Temp 06/08/18 1229 98 F (36.7 C)     Temp Source 06/08/18 1229 Oral     SpO2 06/08/18 1229 99 %     Weight --      Height --      Head Circumference --      Peak Flow --      Pain Score 06/08/18 1233 4     Pain Loc --      Pain Edu? --      Excl. in GC? --    No data found.  Updated Vital Signs BP 108/71 (BP Location: Left Arm)   Pulse 73   Temp 98 F (36.7 C) (Oral)   Resp 16   LMP 05/23/2018   SpO2 99%   Visual Acuity Right Eye Distance:   Left Eye Distance:   Bilateral Distance:    Right Eye Near:   Left Eye Near:    Bilateral Near:     Physical Exam Vitals signs and nursing note reviewed.  Constitutional:      General: She is not in acute distress.    Appearance: She is well-developed.  HENT:     Head: Normocephalic and atraumatic.     Mouth/Throat:     Comments: Oral mucosa pink and moist, no tonsillar enlargement or exudate. Posterior pharynx patent and nonerythematous, no uvula deviation or swelling. Normal phonation. Eyes:     Conjunctiva/sclera: Conjunctivae normal.   Neck:     Musculoskeletal: Neck supple.  Cardiovascular:     Rate and Rhythm: Normal rate and regular rhythm.     Heart sounds: No murmur.  Pulmonary:     Effort: Pulmonary effort is normal. No respiratory distress.     Breath sounds: Normal breath sounds.     Comments: Breathing comfortably at rest, CTABL, no wheezing, rales or other adventitious sounds auscultated Abdominal:     Palpations: Abdomen is soft.     Tenderness: There is abdominal tenderness.     Comments: Soft, nondistended, nontender to palpation of bilateral right upper and lower quadrants, tender to palpation in left lower quadrant, negative rebound, negative Rovsing, negative McBurney's  Skin:    General: Skin is warm and dry.  Neurological:     Mental Status: She is alert.      UC Treatments / Results  Labs (all labs ordered are listed, but only abnormal results are displayed) Labs Reviewed  POCT URINALYSIS DIP (DEVICE)  POCT PREGNANCY, URINE    EKG None  Radiology No results found.  Procedures Procedures (including critical care time)  Medications Ordered in UC Medications  alum & mag hydroxide-simeth (MAALOX/MYLANTA) 200-200-20 MG/5ML suspension 30 mL (30 mLs Oral Given 06/08/18 1332)    And  lidocaine (XYLOCAINE) 2 % viscous mouth solution 15 mL (15 mLs Oral Given 06/08/18 1332)    Initial Impression / Assessment and Plan / UC Course  I have reviewed the triage vital signs and the nursing notes.  Pertinent labs & imaging results that were available during my care of the patient were reviewed by me and considered in my medical decision making (see chart for details).     It was unclear patient's main concern of visit whether it was related to her constipation, follow-up TB test or arthralgias.  Discussed with patient typically TB test and not obtained in the urgent care, but will provide prescription to go to [facility] to have this repeated.  Advised to follow-up with rheumatology for further  evaluation of arthralgias.  Provided GI cocktail as burning sensation in chest and throat seem to be more likely reflux related.  Patient stated that this did improve her symptoms.  Initiated on omeprazole daily, also discussed recommendations for constipation including MiraLAX and Colace, increasing fiber in fluids.  Also discussed lifestyle modifications in order to help with more regular bowels.Discussed strict return precautions. Patient verbalized understanding and is agreeable with plan.  Final Clinical Impressions(s) / UC Diagnoses   Final diagnoses:  Left lower quadrant abdominal pain  Gastroesophageal reflux disease, esophagitis presence not specified  Positive QuantiFERON-TB Gold test     Discharge Instructions     Empiece omeprazole 20 mg diaria para el proximo 2 semanas [place] Miralax en liquido 1 vez cada dia  Colace 2 veces cada dia Puede ir a [facility] para el examen de TB  Please use Miralax for moderate to severe constipation. Take this once a day for the next 2-3 days. Please also start docusate stool softener, twice a day for at least 1 week. If stools become loose, cut down to once a day for another week. If stools remain loose, cut back to 1 pill every other day for a third week. You can stop docusate thereafter and resume as needed for constipation.  To help reduce constipation and promote bowel health: 1. Drink at least 64 ounces of water each day 2. Eat plenty of fiber (fruits, vegetables, whole grains, legumes) 3. Be physically active or exercise including walking, jogging, swimming, yoga, etc. 4. For active constipation use a stool softener (docusate) or an osmotic laxative (like Miralax) each day, or as needed.     ED  Prescriptions    Medication Sig Dispense Auth. Provider   polyethylene glycol powder (GLYCOLAX/MIRALAX) powder  (Status: Discontinued) Take 17 g by mouth once for 1 dose. 255 g ,  C, PA-C   omeprazole (PRILOSEC) 20 MG capsule Take 1  capsule (20 mg total) by mouth daily. 20 capsule ,  C, PA-C   polyethylene glycol powder (GLYCOLAX/MIRALAX) powder  (Status: Discontinued) Take 17 g by mouth once for 1 dose. 255 g ,  C, PA-C   docusate sodium (COLACE) 50 MG capsule Take 1 capsule (50 mg total) by mouth 2 (two) times daily. 20 capsule ,  C, PA-C   polyethylene glycol powder (GLYCOLAX/MIRALAX) powder Take 17 g by mouth once for 1 dose. 255 g , Goodrich C, PA-C     Controlled Substance Prescriptions [place] Controlled Substance Registry consulted? Not Applicable   Lew Dawes, [place] 06/09/18 8850

## 2018-06-10 NOTE — Progress Notes (Deleted)
Office Visit Note  Patient: Tanya Cruz             Date of Birth: 1975/10/28           MRN: [id number]             PCP: Myles Lipps, MD Referring: Myles Lipps, MD Visit Date: 06/24/2018 Occupation: @GUAROCC @  Subjective:  No chief complaint on file.   History of Present Illness: Tanya Cruz is a 43 y.o. female ***   Activities of Daily Living:  Patient reports morning stiffness for *** {minute/hour:19697}.   Patient {ACTIONS;DENIES/REPORTS:21021675::"Denies"} nocturnal pain.  Difficulty dressing/grooming: {ACTIONS;DENIES/REPORTS:21021675::"Denies"} Difficulty climbing stairs: {ACTIONS;DENIES/REPORTS:21021675::"Denies"} Difficulty getting out of chair: {ACTIONS;DENIES/REPORTS:21021675::"Denies"} Difficulty using hands for taps, buttons, cutlery, and/or writing: {ACTIONS;DENIES/REPORTS:21021675::"Denies"}  No Rheumatology ROS completed.   PMFS History:  Patient Active Problem List   Diagnosis Date Noted  . 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 History of gastroesophageal reflux (GERD) 05/06/2018  . Previous cesarean delivery, delivered, with or without mention of antepartum condition 04/07/2012  . Language barrier, cultural differences 04/04/2012    Past Medical History:  Diagnosis Date  . GERD (gastroesophageal reflux disease)   . Tuberculosis     Family History  Problem Relation Age of Onset  . Hyperlipidemia Mother   . Diabetes Father   . Diabetes Paternal Grandmother   . Healthy Son   . Healthy Son   . Healthy Son   . Healthy Daughter    Past Surgical History:  Procedure Laterality Date  . CESAREAN SECTION     x2  . CESAREAN SECTION  04/04/2012   Procedure: CESAREAN SECTION;  Surgeon: Tereso Newcomer, MD;  Location: [facility];  Service: Obstetrics;  Laterality: N/A;  . FOOT FRACTURE SURGERY     Social History   Social History Narrative  . Not on file   Immunization History  Administered Date(s) Administered    . Tdap 04/05/2012     Objective: Vital Signs: There were no vitals taken for this visit.   Physical Exam   Musculoskeletal Exam: ***  CDAI Exam: CDAI Score: Not documented Patient Global Assessment: Not documented; Provider Global Assessment: Not documented Swollen: Not documented; Tender: Not documented Joint Exam   Not documented   There is currently no information documented on the homunculus. Go to the Rheumatology activity and complete the homunculus joint exam.  Investigation: No additional findings.  Imaging: Dg Chest 2 View  Result Date: 06/07/2018 CLINICAL DATA:  Positive TB test. EXAM: CHEST - 2 VIEW COMPARISON:  03/30/2018 FINDINGS: Midline trachea. Normal heart size and mediastinal contours. No pleural effusion or pneumothorax. Clear lungs. IMPRESSION: Normal chest. Electronically Signed   By: Jeronimo Greaves M.D.   On: 06/07/2018 08:19    Recent Labs: Lab Results  Component Value Date   WBC 7.0 06/06/2018   HGB 14.1 06/06/2018   PLT 249 06/06/2018   NA 137 06/06/2018   K 4.1 06/06/2018   CL 104 06/06/2018   CO2 23 06/06/2018   GLUCOSE 154 (H) 06/06/2018   BUN 26 (H) 06/06/2018   CREATININE 0.59 06/06/2018   BILITOT 0.4 06/06/2018   ALKPHOS 74 03/30/2018   AST 29 06/06/2018   ALT 50 (H) 06/06/2018   PROT 7.9 06/06/2018   ALBUMIN 4.6 03/30/2018   CALCIUM 9.1 06/06/2018   GFRAA 116 03/30/2018   QFTBGOLDPLUS POSITIVE (A) 04/22/2018  SPEP normal, IgG elevated, TB Gold negative, hepatitis B-, hepatitis C negative, G6PD normal, CK 63, vitamin D 32  Anticoagulant  negative, beta-2 negative, anticardiolipin negative, ENA negative except anti-Ro positive, C3-C4 normal,  Speciality Comments: No specialty comments available.  Procedures:  No procedures performed Allergies: Patient has no known allergies.   Assessment / Plan:     Visit Diagnoses: No diagnosis found.   Orders: No orders of the defined types were placed in this encounter.  No orders of  the defined types were placed in this encounter.   Face-to-face time spent with patient was *** minutes. Greater than 50% of time was spent in counseling and coordination of care.  Follow-Up Instructions: No follow-ups on file.   [name], MD  Note - This record has been created using Animal nutritionist.  Chart creation errors have been sought, but may not always  have been located. Such creation errors do not reflect on  the standard of medical care.

## 2018-06-13 ENCOUNTER — Telehealth: Payer: Self-pay | Admitting: Emergency Medicine

## 2018-06-13 NOTE — Telephone Encounter (Signed)
[facility] results for repeat QuantiFERON gold negative, called patient to inform of results.  Advised to follow-up with her rheumatologist for further treatment of her symptoms

## 2018-06-18 IMAGING — DX DG ABDOMEN 1V
2 series · 2 of 2 positions shown · non-contrast
Comparison: 08/18/2008

CLINICAL DATA: Left lower quadrant pain for 2 weeks

EXAM:
ABDOMEN - 1 VIEW

[abdomen kub (1 of 2)]
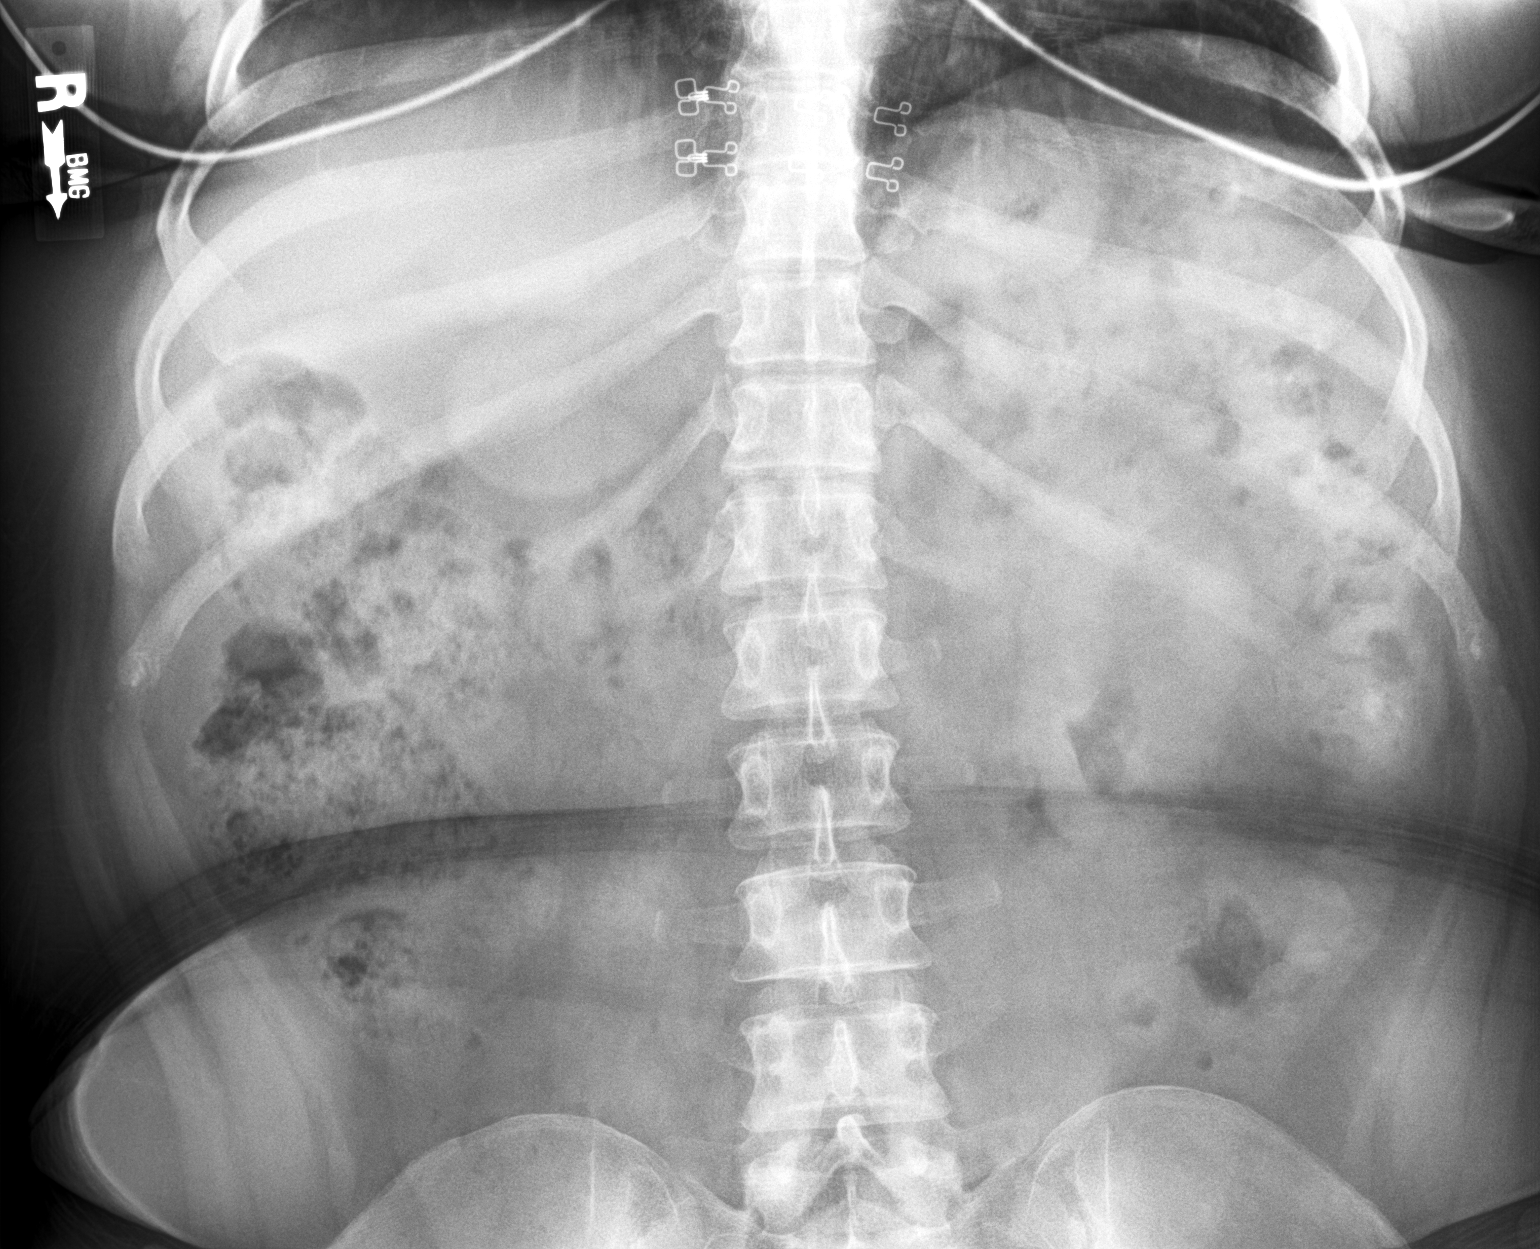

[abdomen kub (2 of 2)]
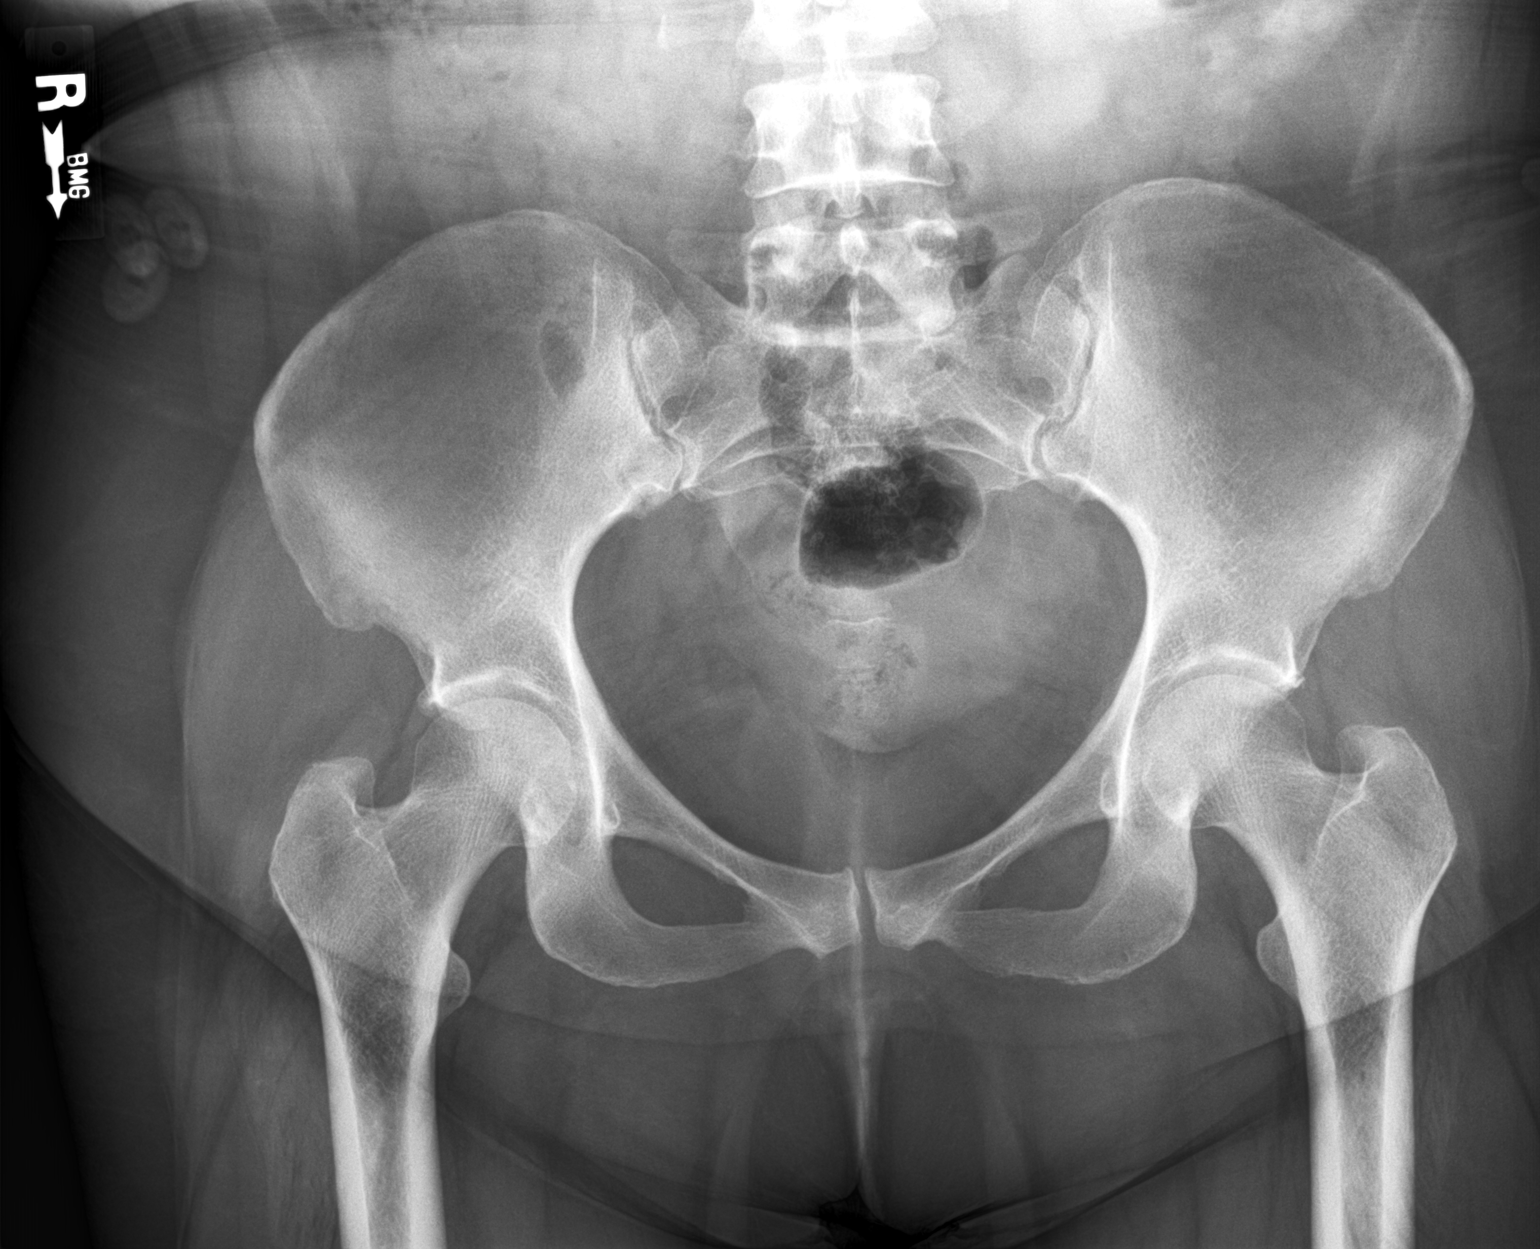

[2 of 2 positions shown; findings below may reference images not displayed]

FINDINGS: Moderate stool burden throughout the colon. There is a non
obstructive bowel gas pattern. No supine evidence of free air. No
organomegaly or suspicious calcification. No acute bony abnormality.
IMPRESSION: Moderate stool burden.  No acute findings.

## 2018-06-24 ENCOUNTER — Ambulatory Visit: Payer: BLUE CROSS/BLUE SHIELD | Admitting: Rheumatology

## 2018-06-25 ENCOUNTER — Emergency Department (HOSPITAL_COMMUNITY): Payer: BLUE CROSS/BLUE SHIELD

## 2018-06-25 ENCOUNTER — Other Ambulatory Visit: Payer: Self-pay

## 2018-06-25 ENCOUNTER — Emergency Department (HOSPITAL_COMMUNITY)
Admission: EM | Admit: 2018-06-25 | Discharge: 2018-06-25 | Disposition: A | Discharge status: Home / Self Care | Payer: BLUE CROSS/BLUE SHIELD | Attending: Emergency Medicine | Admitting: Emergency Medicine

## 2018-06-25 ENCOUNTER — Encounter (HOSPITAL_COMMUNITY): Payer: Self-pay

## 2018-06-25 DIAGNOSIS — R079 Chest pain, unspecified: Secondary | ICD-10-CM | POA: Diagnosis not present

## 2018-06-25 DIAGNOSIS — Z79899 Other long term (current) drug therapy: Secondary | ICD-10-CM | POA: Diagnosis not present

## 2018-06-25 DIAGNOSIS — K219 Gastro-esophageal reflux disease without esophagitis: Secondary | ICD-10-CM | POA: Diagnosis not present

## 2018-06-25 LAB — D-DIMER, QUANTITATIVE: D-Dimer, Quant: 0.39 ug/mL-FEU (ref 0.00–0.50)

## 2018-06-25 LAB — BASIC METABOLIC PANEL
Anion gap: 7 (ref 5–15)
BUN: 20 mg/dL (ref 6–20)
CO2: 24 mmol/L (ref 22–32)
Calcium: 8.5 mg/dL — ABNORMAL LOW (ref 8.9–10.3)
Chloride: 107 mmol/L (ref 98–111)
Creatinine, Ser: 0.96 mg/dL (ref 0.44–1.00)
GFR calc Af Amer: >60 mL/min (ref >60)
Glucose, Bld: 89 mg/dL (ref 70–99)
Potassium: 3.7 mmol/L (ref 3.5–5.1)
Sodium: 138 mmol/L (ref 135–145)

## 2018-06-25 LAB — CBC
HCT: 40.6 % (ref 36.0–46.0)
Hemoglobin: 12.9 g/dL (ref 12.0–15.0)
MCH: 27.7 pg (ref 26.0–34.0)
MCHC: 31.8 g/dL (ref 30.0–36.0)
MCV: 87.3 fL (ref 80.0–100.0)
Platelets: 206 10*3/uL (ref 150–400)
RBC: 4.65 MIL/uL (ref 3.87–5.11)
RDW: 13.8 % (ref 11.5–15.5)
WBC: 8.2 10*3/uL (ref 4.0–10.5)
nRBC: 0 % (ref 0.0–0.2)

## 2018-06-25 LAB — I-STAT BETA HCG BLOOD, ED (MC, WL, AP ONLY): I-stat hCG, quantitative: <5 m[IU]/mL (ref <5)

## 2018-06-25 LAB — I-STAT TROPONIN, ED: Troponin i, poc: 0 ng/mL (ref 0.00–0.08)

## 2018-06-25 MED ORDER — FAMOTIDINE 20 MG PO TABS: 20.0000 mg | ORAL_TABLET | Freq: Two times a day (BID) | ORAL | 0 refills | Status: DC

## 2018-06-25 MED ORDER — SUCRALFATE 1 G PO TABS: 1.0000 g | ORAL_TABLET | Freq: Four times a day (QID) | ORAL | 0 refills | Status: DC

## 2018-06-25 MED ORDER — SODIUM CHLORIDE 0.9% FLUSH
3.0000 mL | Freq: Once | INTRAVENOUS | Status: AC
  Administered 2018-06-25: 3 mL via INTRAVENOUS

## 2018-06-25 NOTE — ED Triage Notes (Signed)
Patient c/o left chest pain and numbness of the left side of her tongue x 1 hour ago. Patient denies any SOB, N/V.

## 2018-06-25 NOTE — ED Provider Notes (Signed)
[facility]-EMERGENCY DEPT Provider Note   CSN: [id number] Arrival date & time: 06/25/18  1518    History   Chief Complaint Chief Complaint  Patient presents with  . Chest Pain    HPI Tanya Cruz is a 43 y.o. female.     43 year old female presents with left-sided chest pain that began after she was eating.  States that the symptoms wax and wane and are not associated with dizziness or diaphoresis.  States that she has not had any burping or passing flatus.  No fever or chills.  No cough or congestion.  No prior issue of same.  Denies any leg pain or swelling.  Pain does radiate and cause some tingling to the left side of her tongue.  Denies any other neurological features.  No weakness in her upper or lower extremities.  Said symptoms are nonexertional.  No treatment used prior to arrival.     Past Medical History:  Diagnosis Date  . GERD (gastroesophageal reflux disease)   . Tuberculosis    Patient states she went for a second opinion and was told tshe did not have TB    Patient Active Problem List   Diagnosis Date Noted  . 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 History of gastroesophageal reflux (GERD) 05/06/2018  . Previous cesarean delivery, delivered, with or without mention of antepartum condition 04/07/2012  . Language barrier, cultural differences 04/04/2012    Past Surgical History:  Procedure Laterality Date  . CESAREAN SECTION     x2  . CESAREAN SECTION  04/04/2012   Procedure: CESAREAN SECTION;  Surgeon: Tereso Newcomer, MD;  Location: [facility];  Service: Obstetrics;  Laterality: N/A;  . FOOT FRACTURE SURGERY       OB History    Gravida  4   Para  4   Term  1   Preterm      AB      Living  3     SAB      TAB      Ectopic      Multiple      Live Births               Home Medications    Prior to Admission medications   Medication Sig Start Date End Date Taking? Authorizing Provider    docusate sodium (COLACE) 50 MG capsule Take 1 capsule (50 mg total) by mouth 2 (two) times daily. 06/08/18   Wieters, Hallie C, PA-C  Multiple Vitamin (MULTIVITAMIN) tablet Take 1 tablet by mouth daily.    [provider]  omeprazole (PRILOSEC) 20 MG capsule Take 1 capsule (20 mg total) by mouth daily. 06/08/18   Wieters, Hallie C, PA-C  rifampin (RIFADIN) 300 MG capsule Take 2 capsules (600 mg total) by mouth daily. 06/06/18   Gardiner Barefoot, MD    Family History Family History  Problem Relation Age of Onset  . Hyperlipidemia Mother   . Diabetes Father   . Diabetes Paternal Grandmother   . Healthy Son   . Healthy Son   . Healthy Son   . Healthy Daughter     Social History Social History   Tobacco Use  . Smoking status: Never Smoker  . Smokeless tobacco: Never Used  Substance Use Topics  . Alcohol use: Never    Frequency: Never  . Drug use: Never     Allergies   Patient has no known allergies.   Review of Systems Review of Systems  All other systems reviewed and are negative.    Physical Exam Updated Vital Signs BP 122/75 (BP Location: Left Arm)   Pulse 78   Temp 98 F (36.7 C) (Oral)   Resp 18   Ht 1.626 m (5\' 4" )   Wt 86.2 kg   LMP 05/28/2018 (Approximate)   SpO2 98%   BMI 32.61 kg/m   Physical Exam Vitals signs and nursing note reviewed.  Constitutional:      General: She is not in acute distress.    Appearance: Normal appearance. She is well-developed. She is not toxic-appearing.  HENT:     Head: Normocephalic and atraumatic.  Eyes:     General: Lids are normal.     Conjunctiva/sclera: Conjunctivae normal.     Pupils: Pupils are equal, round, and reactive to light.  Neck:     Musculoskeletal: Normal range of motion and neck supple.     Thyroid: No thyroid mass.     Trachea: No tracheal deviation.  Cardiovascular:     Rate and Rhythm: Normal rate and regular rhythm.     Heart sounds: Normal heart sounds. No murmur. No gallop.    Pulmonary:     Effort: Pulmonary effort is normal. No respiratory distress.     Breath sounds: Normal breath sounds. No stridor. No decreased breath sounds, wheezing, rhonchi or rales.  Chest:     Chest wall: Tenderness present. No mass.    Abdominal:     General: Bowel sounds are normal. There is no distension.     Palpations: Abdomen is soft.     Tenderness: There is no abdominal tenderness. There is no rebound.  Musculoskeletal: Normal range of motion.        General: No tenderness.  Skin:    General: Skin is warm and dry.     Findings: No abrasion or rash.  Neurological:     Mental Status: She is alert and oriented to person, place, and time.     GCS: GCS eye subscore is 4. GCS verbal subscore is 5. GCS motor subscore is 6.     Cranial Nerves: No cranial nerve deficit.     Sensory: No sensory deficit.  Psychiatric:        Speech: Speech normal.        Behavior: Behavior normal.      ED Treatments / Results  Labs (all labs ordered are listed, but only abnormal results are displayed) Labs Reviewed  BASIC METABOLIC PANEL  CBC  D-DIMER, QUANTITATIVE (NOT AT [facility])  I-STAT TROPONIN, ED  I-STAT BETA HCG BLOOD, ED ([facility], [facility], [facility] ONLY)    EKG EKG Interpretation  Date/Time:  Saturday June 25 2018 15:29:26 EST Ventricular Rate:  84 PR Interval:    QRS Duration: 92 QT Interval:  364 QTC Calculation: 431 R Axis:   -19 Text Interpretation:  Sinus rhythm Borderline left axis deviation Low voltage, precordial leads Abnormal R-wave progression, early transition Confirmed by Lorre Nick ([id number]) on 06/25/2018 3:51:34 PM   Radiology Dg Chest 2 View  Result Date: 06/25/2018 CLINICAL DATA:  Patient c/o left chest pain and numbness of the left side of her tongue x 1 hour ago. Patient denies any SOB, N/V. EXAM: CHEST - 2 VIEW COMPARISON:  06/06/2018 FINDINGS: The heart size and mediastinal contours are within normal limits. Both lungs are clear. The visualized skeletal  structures are unremarkable. IMPRESSION: No active cardiopulmonary disease. Electronically Signed   By: Norva Pavlov M.D.   On: 06/25/2018 16:13  Procedures Procedures (including critical care time)  Medications Ordered in ED Medications  sodium chloride flush (NS) 0.9 % injection 3 mL (has no administration in time range)     Initial Impression / Assessment and Plan / ED Course  I have reviewed the triage vital signs and the nursing notes.  Pertinent labs & imaging results that were available during my care of the patient were reviewed by me and considered in my medical decision making (see chart for details).        Patient's work-up here is negative for evidence of cardiac ischemia.  D-dimer negative as well 2.  She is a heart score of 0.  Symptoms began while eating and suspect some component of GERD and patient is stable for discharge  Final Clinical Impressions(s) / ED Diagnoses   Final diagnoses:  None    ED Discharge Orders    None       Lorre Nick, MD 06/25/18 1750

## 2018-06-25 NOTE — ED Notes (Addendum)
Documentation ERROR. IV  not maintained by tech.

## 2018-06-25 NOTE — ED Notes (Signed)
UNSUCCESSFUL ATTEMPT TO COLLECT LABS

## 2018-07-19 ENCOUNTER — Ambulatory Visit: Payer: BLUE CROSS/BLUE SHIELD | Admitting: Internal Medicine

## 2018-09-27 ENCOUNTER — Other Ambulatory Visit: Payer: Self-pay

## 2018-09-27 ENCOUNTER — Encounter: Payer: Self-pay | Admitting: Emergency Medicine

## 2018-09-27 ENCOUNTER — Ambulatory Visit: Payer: BLUE CROSS/BLUE SHIELD | Admitting: Emergency Medicine

## 2018-09-27 VITALS — BP 122/64 | HR 75 | Temp 98.5°F | Ht 63.0 in

## 2018-09-27 DIAGNOSIS — K2100 Gastro-esophageal reflux disease with esophagitis, without bleeding: Secondary | ICD-10-CM

## 2018-09-27 DIAGNOSIS — R0789 Other chest pain: Secondary | ICD-10-CM

## 2018-09-27 MED ORDER — OMEPRAZOLE 40 MG PO CPDR: 40.0000 mg | DELAYED_RELEASE_CAPSULE | Freq: Every day | ORAL | 3 refills | Status: DC

## 2018-09-27 NOTE — Patient Instructions (Addendum)
   If you have lab work done today you will be contacted with your lab results within the next 2 weeks.  If you have not heard from us then please contact us. The fastest way to get your results is to register for My Chart.   IF you received an x-ray today, you will receive an invoice from [facility]. Please contact [facility] at [phone] with questions or concerns regarding your invoice.   IF you received labwork today, you will receive an invoice from [facility]. Please contact [facility] at [phone] with questions or concerns regarding your invoice.   Our billing staff will not be able to assist you with questions regarding bills from these companies.  You will be contacted with the lab results as soon as they are available. The fastest way to get your results is to activate your My Chart account. Instructions are located on the last page of this paperwork. If you have not heard from us regarding the results in 2 weeks, please contact this office.     Opciones de alimentos para pacientes adultos con enfermedad de reflujo gastroesofgico Food Choices for Gastroesophageal Reflux Disease, Adult Si tiene enfermedad de reflujo gastroesofgico (ERGE), los alimentos que consume y los hbitos de alimentacin son muy importantes. Elegir los alimentos adecuados puede ayudar a aliviar las molestias. Piense en consultar a un especialista en nutricin (nutricionista) para que lo ayude a hacer buenas elecciones. Consejos para seguir este plan  Comidas  Elija alimentos saludables con bajo contenido de grasa, como frutas, verduras, cereales integrales, productos lcteos descremados y carne magra de vaca, de pescado y de ave.  Haga comidas pequeas durante el da en lugar de 3 comidas abundantes. Coma lentamente y en un lugar donde est distendido. Evite agacharse o recostarse hasta 2 o 3horas despus de haber comido.  Evite comer 2 a 3horas antes de ir a acostarse.  Evite  beber grandes cantidades de lquidos con las comidas.  Evite frer los alimentos a la hora de la coccin. Puede hornear, grillar o asar a la parrilla.  Evite o limite la cantidad de: ? Chocolate. ? Menta y mentol. ? Alcohol. ? Pimienta. ? Caf negro y descafeinado. ? T negro y descafeinado. ? Bebidas con gas (gaseosas). ? Bebidas energizantes y refrescos que contengan cafena.  Limite los alimentos con alto contenido de grasas, por ejemplo: ? Carnes grasas o alimentos fritos. ? Leche entera, crema, manteca o helado. ? Nueces y mantequillas de frutos secos. ? Pastelera, donas y dulces hechos con manteca o margarina.  Evite los alimentos que le ocasionen sntomas. Estos pueden ser distintos para cada persona. Los alimentos que suelen causan sntomas son los siguientes: ? Tomates. ? Naranjas, limones y limas. ? Pimientos. ? Comidas condimentadas. ? Cebolla y ajo. ? Vinagre. Estilo de vida  Mantenga un peso saludable. Pregntele a su mdico cul es el peso saludable para usted. Si necesita perder peso, hable con su mdico para hacerlo de manera segura.  Realice actividad fsica durante, al menos, 30 minutos 5 das por semana o ms, o segn lo indicado por su mdico.  Use ropa suelta.  No fume. Si necesita ayuda para dejar de fumar, consulte al mdico.  Duerma con la cabecera de la cama ms elevada que los pies. Use una cua debajo del colchn o bloques debajo del armazn de la cama para mantener la cabecera de la cama elevada. Resumen  Si tiene enfermedad de reflujo gastroesofgico (ERGE), las elecciones de alimentos y el estilo   de vida son muy importantes para ayudar a Merchant navy officer.  Haga comidas pequeas durante Glass blower/designer de 3 comidas abundantes. Coma lentamente y en un lugar donde est distendido.  Limite los alimentos con alto contenido graso como la carne grasa o los alimentos fritos.  Evite agacharse o recostarse hasta 2 o 3horas despus de haber  comido.  Evite la menta y [place] buena, la cafena, el alcohol y el chocolate. Esta informacin no tiene Theme park manager el consejo del mdico. Asegrese de hacerle al mdico cualquier pregunta que tenga. Document Released: 10/13/2011 Document Revised: 11/17/2016 Document Reviewed: 11/17/2016 Elsevier Interactive Patient Education  2019 [employer].

## 2018-09-27 NOTE — Progress Notes (Signed)
Tanya Cruz 43 y.o.   Chief Complaint  Patient presents with  . Chest Pain    upper x 2 month comes and goes with tingling left arm  . Nausea    and medication refill - Rifadin    HISTORY OF PRESENT ILLNESS: This is a 43 y.o. female complaining of almost daily episodes of burning pain to middle of her chest at times radiating into her neck and both shoulder areas without any other associated symptoms.  Seen at the urgent care center on 06/25/2018 for the same and was started on omeprazole with good results but has been out of medication for 2 months now and symptoms are coming back.  She had a normal EKG done then.  No other work-up was initiated then. States that she has a history of anemia and vitamin D deficiency. Also states that she wants tested positive for TB blood test but a repeat test was negative.  She took rifampin for short while but then was told not to continue it.  Told that she does not have tuberculosis.  HPI   Prior to Admission medications   Medication Sig Start Date End Date Taking? Authorizing Provider  Ferrous Sulfate (IRON PO) Take 0.5 tablets by mouth daily.   Yes [provider]  Multiple Vitamin (MULTIVITAMIN) tablet Take 1 tablet by mouth daily.   Yes [provider]  cholecalciferol (VITAMIN D3) 25 MCG (1000 UT) tablet Take 1,000 Units by mouth daily.    [provider]  docusate sodium (COLACE) 50 MG capsule Take 1 capsule (50 mg total) by mouth 2 (two) times daily. Patient not taking: Reported on 09/27/2018 06/08/18   Wieters, Hallie C, PA-C  famotidine (PEPCID) 20 MG tablet Take 1 tablet (20 mg total) by mouth 2 (two) times daily. Patient not taking: Reported on 09/27/2018 06/25/18   Lorre Nick, MD  omeprazole (PRILOSEC) 20 MG capsule Take 1 capsule (20 mg total) by mouth daily. Patient not taking: Reported on 09/27/2018 06/08/18   Wieters, Hallie C, PA-C  rifampin (RIFADIN) 300 MG capsule Take 2 capsules (600 mg total)  by mouth daily. Patient not taking: Reported on 09/27/2018 06/06/18   Gardiner Barefoot, MD  sucralfate (CARAFATE) 1 g tablet Take 1 tablet (1 g total) by mouth 4 (four) times daily. Patient not taking: Reported on 09/27/2018 06/25/18   Lorre Nick, MD    No Known Allergies  Patient Active Problem List   Diagnosis Date Noted  . 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 History of gastroesophageal reflux (GERD) 05/06/2018    Past Medical History:  Diagnosis Date  . GERD (gastroesophageal reflux disease)   . Tuberculosis    Patient states she went for a second opinion and was told tshe did not have TB    Past Surgical History:  Procedure Laterality Date  . CESAREAN SECTION     x2  . CESAREAN SECTION  04/04/2012   Procedure: CESAREAN SECTION;  Surgeon: Tereso Newcomer, MD;  Location: [facility];  Service: Obstetrics;  Laterality: N/A;  . FOOT FRACTURE SURGERY      Social History   Socioeconomic History  . Marital status: Married    Spouse name: Not on file  . Number of children: 4  . Years of education: Not on file  . Highest education level: Not on file  Occupational History  . Not on file  Social Needs  . Financial resource strain: Not on file  . Food insecurity:    Worry:  Not on file    Inability: Not on file  . Transportation needs:    Medical: Not on file    Non-medical: Not on file  Tobacco Use  . Smoking status: Never Smoker  . Smokeless tobacco: Never Used  Substance and Sexual Activity  . Alcohol use: Never    Frequency: Never  . Drug use: Never  . Sexual activity: Yes  Lifestyle  . Physical activity:    Days per week: Not on file    Minutes per session: Not on file  . Stress: Not on file  Relationships  . Social connections:    Talks on phone: Not on file    Gets together: Not on file    Attends religious service: Not on file    Active member of club or organization: Not on file    Attends meetings of clubs or organizations: Not on file     Relationship status: Not on file  . Intimate partner violence:    Fear of current or ex partner: Not on file    Emotionally abused: Not on file    Physically abused: Not on file    Forced sexual activity: Not on file  Other Topics Concern  . Not on file  Social History Narrative  . Not on file    Family History  Problem Relation Age of Onset  . Hyperlipidemia Mother   . Diabetes Father   . Diabetes Paternal Grandmother   . Healthy Son   . Healthy Son   . Healthy Son   . Healthy Daughter      Review of Systems  Constitutional: Negative.  Negative for chills and fever.  HENT: Negative.  Negative for congestion and nosebleeds.   Eyes: Negative.   Respiratory: Negative.  Negative for cough and shortness of breath.   Cardiovascular: Positive for chest pain. Negative for palpitations and leg swelling.  Gastrointestinal: Positive for heartburn and nausea. Negative for abdominal pain, blood in stool, diarrhea, melena and vomiting.  Genitourinary: Negative.  Negative for dysuria.  Musculoskeletal: Positive for joint pain and myalgias.  Skin: Negative.  Negative for rash.  Neurological: Negative for dizziness and headaches.  Endo/Heme/Allergies: Negative.   All other systems reviewed and are negative.   Vitals:   09/27/18 1104  Pulse: 75  Temp: 98.5 F (36.9 C)  SpO2: 98%    Physical Exam Vitals signs reviewed.  Constitutional:      Appearance: Normal appearance. She is well-developed. She is obese.  HENT:     Head: Normocephalic and atraumatic.     Nose: Nose normal.     Mouth/Throat:     Mouth: Mucous membranes are moist.     Pharynx: Oropharynx is clear.  Eyes:     Extraocular Movements: Extraocular movements intact.     Conjunctiva/sclera: Conjunctivae normal.     Pupils: Pupils are equal, round, and reactive to light.  Neck:     Musculoskeletal: Normal range of motion and neck supple.  Cardiovascular:     Rate and Rhythm: Normal rate and regular rhythm.      Pulses: Normal pulses.     Heart sounds: Normal heart sounds.  Pulmonary:     Effort: Pulmonary effort is normal.     Breath sounds: Normal breath sounds.  Abdominal:     General: There is no distension.     Palpations: Abdomen is soft.     Tenderness: There is no abdominal tenderness.  Musculoskeletal: Normal range of motion.  Skin:  General: Skin is warm and dry.     Capillary Refill: Capillary refill takes less than 2 seconds.  Neurological:     General: No focal deficit present.     Mental Status: She is alert and oriented to person, place, and time.  Psychiatric:        Mood and Affect: Mood normal.        Behavior: Behavior normal.    EKG normal sinus rhythm with ventricular rate of 78/min.  No acute ischemic changes.  Normal intervals.  A total of 25 minutes was spent in the room with the patient, greater than 50% of which was in counseling/coordination of care regarding differential diagnosis including both cardiac and GI possibilities.  GERD diagnosis explained to patient.  EKG results discussed with patient.  Advised about diet and nutrition and its role in gastroesophageal reflux disease.  Advised about medication and side effects.  Prognosis and need for follow-up also discussed.   ASSESSMENT & PLAN: Tanya Cruz was seen today for chest pain and nausea.  Diagnoses and all orders for this visit:  Atypical chest pain -     EKG 12-Lead -     CBC with Differential/Platelet -     Comprehensive metabolic panel -     Hemoglobin A1c -     Lipid panel -     VITAMIN D 25 Hydroxy (Vit-D Deficiency, Fractures)  Gastroesophageal reflux disease with esophagitis -     CBC with Differential/Platelet -     Comprehensive metabolic panel -     Hemoglobin A1c -     Lipid panel -     VITAMIN D 25 Hydroxy (Vit-D Deficiency, Fractures) -     omeprazole (PRILOSEC) 40 MG capsule; Take 1 capsule (40 mg total) by mouth daily.    Patient Instructions       If you have lab work  done today you will be contacted with your lab results within the next 2 weeks.  If you have not heard from us then please contact us. The fastest way to get your results is to register for My Chart.   IF you received an x-ray today, you will receive an invoice from [facility]. Please contact [facility] at [phone] with questions or concerns regarding your invoice.   IF you received labwork today, you will receive an invoice from [facility]. Please contact [facility] at [phone] with questions or concerns regarding your invoice.   Our billing staff will not be able to assist you with questions regarding bills from these companies.  You will be contacted with the lab results as soon as they are available. The fastest way to get your results is to activate your My Chart account. Instructions are located on the last page of this paperwork. If you have not heard from us regarding the results in 2 weeks, please contact this office.     Opciones de alimentos para pacientes adultos con enfermedad de reflujo gastroesofgico Food Choices for Gastroesophageal Reflux Disease, Adult Si tiene enfermedad de reflujo gastroesofgico (ERGE), los alimentos que consume y los hbitos de alimentacin son muy importantes. Elegir los alimentos adecuados puede ayudar a Altria Groupaliviar las molestias. Piense en consultar a un especialista en nutricin (nutricionista) para que lo ayude a Associate Professorhacer buenas elecciones. Consejos para seguir [employer] Corporationeste plan  Comidas  Elija alimentos saludables con bajo contenido de grasa, como frutas, verduras, cereales integrales, productos lcteos descremados y carne [place], de pescado y de [place].  Haga comidas pequeas durante el  Geophysical data processor de 3 comidas abundantes. Coma lentamente y en un lugar donde est distendido. Evite agacharse o recostarse hasta 2 o 3horas despus de haber comido.  Evite comer 2 a 3horas antes de ir a acostarse.  Evite beber grandes cantidades de  lquidos con las comidas.  Evite frer los alimentos a la hora de la coccin. Puede hornear, grillar o asar a la parrilla.  Evite o limite la cantidad de: ? Chocolate. ? Menta y mentol. ? Alcohol. ? Pimienta. ? Caf negro y descafeinado. ? T negro y descafeinado. ? Bebidas con gas (gaseosas). ? Bebidas energizantes y refrescos que contengan cafena.  Limite los alimentos con alto contenido de [place], por ejemplo: ? Carnes grasas o alimentos fritos. ? Leche entera, crema, manteca o helado. ? Nueces y [place] de frutos secos. ? Pastelera, donas y dulces hechos con [place] o [place].  Evite los alimentos que le ocasionen sntomas. Estos pueden ser distintos para Advertising account planner. Los alimentos que suelen causan sntomas son los siguientes: ? Haematologist. ? Naranjas, limones y limas. ? Pimientos. ? Comidas condimentadas. ? Cebolla y Kae Heller. ? Vinagre. Estilo de vida  Mantenga un peso saludable. Pregntele a su mdico cul es el peso saludable para usted. Si necesita perder peso, hable con su mdico para hacerlo de manera segura.  Realice actividad fsica durante, al menos, 30 minutos 5 das por semana o ms, o segn lo indicado por su mdico.  Use ropa suelta.  No fume. Si necesita ayuda para dejar de fumar, consulte al mdico.  Duerma con la cabecera de la cama ms elevada que los pies. Use una cua debajo del colchn o bloques debajo del armazn de la cama para Pharmacologist la cabecera de la cama elevada. Resumen  Si tiene enfermedad de reflujo gastroesofgico (ERGE), las elecciones de alimentos y el [place] de vida son muy importantes para ayudar a Paramedic los sntomas.  Haga comidas pequeas durante Glass blower/designer de 3 comidas abundantes. Coma lentamente y en un lugar donde est distendido.  Limite los alimentos con alto contenido graso como la carne grasa o los alimentos fritos.  Evite agacharse o recostarse hasta 2 o 3horas despus de haber comido.  Evite la menta y  [place] buena, la cafena, el alcohol y el chocolate. Esta informacin no tiene Theme park manager el consejo del mdico. Asegrese de hacerle al mdico cualquier pregunta que tenga. Document Released: 10/13/2011 Document Revised: 11/17/2016 Document Reviewed: 11/17/2016 Elsevier Interactive Patient Education  2019 Elsevier Inc.      Edwina Barth, MD [facility]

## 2018-09-28 ENCOUNTER — Telehealth: Payer: Self-pay | Admitting: Emergency Medicine

## 2018-09-28 LAB — HEMOGLOBIN A1C
Est. average glucose Bld gHb Est-mCnc: 143 mg/dL
Hgb A1c MFr Bld: 6.6 % — ABNORMAL HIGH (ref 4.8–5.6)

## 2018-09-28 LAB — VITAMIN D 25 HYDROXY (VIT D DEFICIENCY, FRACTURES): Vit D, 25-Hydroxy: 25.8 ng/mL — ABNORMAL LOW (ref 30.0–100.0)

## 2018-09-28 LAB — COMPREHENSIVE METABOLIC PANEL
ALT: 95 IU/L — ABNORMAL HIGH (ref 0–32)
AST: 43 IU/L — ABNORMAL HIGH (ref 0–40)
Albumin/Globulin Ratio: 1.4 (ref 1.2–2.2)
Albumin: 4.2 g/dL (ref 3.8–4.8)
Alkaline Phosphatase: 82 IU/L (ref 39–117)
BUN/Creatinine Ratio: 25 — ABNORMAL HIGH (ref 9–23)
BUN: 14 mg/dL (ref 6–24)
Bilirubin Total: 0.2 mg/dL (ref 0.0–1.2)
CO2: 19 mmol/L — ABNORMAL LOW (ref 20–29)
Calcium: 8.6 mg/dL — ABNORMAL LOW (ref 8.7–10.2)
Chloride: 102 mmol/L (ref 96–106)
Creatinine, Ser: 0.56 mg/dL — ABNORMAL LOW (ref 0.57–1.00)
GFR calc Af Amer: 130 mL/min/{1.73_m2} (ref >59)
GFR calc non Af Amer: 113 mL/min/{1.73_m2} (ref >59)
Globulin, Total: 3 g/dL (ref 1.5–4.5)
Glucose: 107 mg/dL — ABNORMAL HIGH (ref 65–99)
Potassium: 4 mmol/L (ref 3.5–5.2)
Sodium: 139 mmol/L (ref 134–144)
Total Protein: 7.2 g/dL (ref 6.0–8.5)

## 2018-09-28 LAB — LIPID PANEL
Chol/HDL Ratio: 3.7 ratio (ref 0.0–4.4)
Cholesterol, Total: 150 mg/dL (ref 100–199)
HDL: 41 mg/dL (ref >39)
LDL Calculated: 77 mg/dL (ref 0–99)
Triglycerides: 160 mg/dL — ABNORMAL HIGH (ref 0–149)
VLDL Cholesterol Cal: 32 mg/dL (ref 5–40)

## 2018-09-28 LAB — CBC WITH DIFFERENTIAL/PLATELET
Basophils Absolute: 0 10*3/uL (ref 0.0–0.2)
Basos: 1 %
EOS (ABSOLUTE): 0.1 10*3/uL (ref 0.0–0.4)
Eos: 2 %
Hematocrit: 42.2 % (ref 34.0–46.6)
Hemoglobin: 13.2 g/dL (ref 11.1–15.9)
Immature Grans (Abs): 0 10*3/uL (ref 0.0–0.1)
Immature Granulocytes: 0 %
Lymphocytes Absolute: 2.5 10*3/uL (ref 0.7–3.1)
Lymphs: 41 %
MCH: 27.6 pg (ref 26.6–33.0)
MCHC: 31.3 g/dL — ABNORMAL LOW (ref 31.5–35.7)
MCV: 88 fL (ref 79–97)
Monocytes Absolute: 0.4 10*3/uL (ref 0.1–0.9)
Monocytes: 7 %
Neutrophils Absolute: 3 10*3/uL (ref 1.4–7.0)
Neutrophils: 49 %
Platelets: 220 10*3/uL (ref 150–450)
RBC: 4.79 x10E6/uL (ref 3.77–5.28)
RDW: 14.8 % (ref 11.7–15.4)
WBC: 6.1 10*3/uL (ref 3.4–10.8)

## 2018-09-28 NOTE — Telephone Encounter (Signed)
Called to discuss blood results.  No answer. 

## 2018-09-29 ENCOUNTER — Other Ambulatory Visit: Payer: Self-pay | Admitting: Emergency Medicine

## 2018-09-29 ENCOUNTER — Telehealth: Payer: Self-pay | Admitting: Emergency Medicine

## 2018-09-29 MED ORDER — METFORMIN HCL 500 MG PO TABS: 500.0000 mg | ORAL_TABLET | Freq: Two times a day (BID) | ORAL | 3 refills | Status: DC

## 2018-09-29 NOTE — Progress Notes (Signed)
lm

## 2018-11-25 ENCOUNTER — Encounter: Payer: BLUE CROSS/BLUE SHIELD | Admitting: Women's Health

## 2018-12-19 ENCOUNTER — Telehealth: Payer: Self-pay | Admitting: Family Medicine

## 2018-12-19 NOTE — Telephone Encounter (Signed)
Patient was called to both numbers listed in Epic.A voicemail message was left about her appointment. Used Warehouse manager.

## 2018-12-20 ENCOUNTER — Encounter: Payer: Self-pay | Admitting: Advanced Practice Midwife

## 2018-12-29 ENCOUNTER — Ambulatory Visit: Payer: BC Managed Care – PPO | Admitting: Family Medicine

## 2019-01-04 ENCOUNTER — Encounter: Payer: Self-pay | Admitting: Emergency Medicine

## 2019-01-04 ENCOUNTER — Ambulatory Visit: Payer: BC Managed Care – PPO | Admitting: Emergency Medicine

## 2019-01-04 ENCOUNTER — Other Ambulatory Visit: Payer: Self-pay

## 2019-01-04 VITALS — BP 118/79 | HR 67 | Temp 99.0°F | Resp 16 | Ht 63.0 in | Wt 203.0 lb

## 2019-01-04 DIAGNOSIS — R079 Chest pain, unspecified: Secondary | ICD-10-CM

## 2019-01-04 DIAGNOSIS — E1165 Type 2 diabetes mellitus with hyperglycemia: Secondary | ICD-10-CM | POA: Diagnosis not present

## 2019-01-04 DIAGNOSIS — Z23 Encounter for immunization: Secondary | ICD-10-CM | POA: Diagnosis not present

## 2019-01-04 DIAGNOSIS — S6992XA Unspecified injury of left wrist, hand and finger(s), initial encounter: Secondary | ICD-10-CM | POA: Diagnosis not present

## 2019-01-04 LAB — GLUCOSE, POCT (MANUAL RESULT ENTRY): POC Glucose: 140 mg/dl — AB (ref 70–99)

## 2019-01-04 LAB — POCT GLYCOSYLATED HEMOGLOBIN (HGB A1C): Hemoglobin A1C: 6.5 % — AB (ref 4.0–5.6)

## 2019-01-04 MED ORDER — GLIPIZIDE 5 MG PO TABS: 5.0000 mg | ORAL_TABLET | Freq: Two times a day (BID) | ORAL | 3 refills | Status: DC

## 2019-01-04 NOTE — Patient Instructions (Addendum)
   If you have lab work done today you will be contacted with your lab results within the next 2 weeks.  If you have not heard from us then please contact us. The fastest way to get your results is to register for My Chart.   IF you received an x-ray today, you will receive an invoice from [facility]. Please contact [facility] at [phone] with questions or concerns regarding your invoice.   IF you received labwork today, you will receive an invoice from [facility]. Please contact [facility] at [phone] with questions or concerns regarding your invoice.   Our billing staff will not be able to assist you with questions regarding bills from these companies.  You will be contacted with the lab results as soon as they are available. The fastest way to get your results is to activate your My Chart account. Instructions are located on the last page of this paperwork. If you have not heard from us regarding the results in 2 weeks, please contact this office.     Diabetes mellitus y nutricin, en adultos Diabetes Mellitus and Nutrition, Adult Si sufre de diabetes (diabetes mellitus), es muy importante tener hbitos alimenticios saludables debido a que sus niveles de azcar en la sangre (glucosa) se ven afectados en gran medida por lo que come y bebe. [place] alimentos saludables en las cantidades adecuadas, aproximadamente a la misma hora todos los das, lo ayudar a:  Controlar la glucemia.  Disminuir el riesgo de sufrir una enfermedad cardaca.  Mejorar la presin arterial.  Alcanzar o mantener un peso saludable. Todas las personas que sufren de diabetes son diferentes y cada una tiene necesidades diferentes en cuanto a un plan de alimentacin. El mdico puede recomendarle que trabaje con un especialista en dietas y nutricin (nutricionista) para elaborar el mejor plan para usted. Su plan de alimentacin puede variar segn factores como:  Las caloras que  necesita.  Los medicamentos que toma.  Su peso.  Sus niveles de glucemia, presin arterial y colesterol.  Su nivel de actividad.  Otras afecciones que tenga, como enfermedades cardacas o renales. Cmo me afectan los carbohidratos? Los carbohidratos, o hidratos de carbono, afectan su nivel de glucemia ms que cualquier otro tipo de alimento. La ingesta de carbohidratos naturalmente aumenta la cantidad de glucosa en la sangre. El recuento de carbohidratos es un mtodo destinado a llevar un registro de la cantidad de carbohidratos que se consumen. El recuento de carbohidratos es importante para mantener la glucemia a un nivel saludable, especialmente si utiliza insulina o toma determinados medicamentos por va oral para la diabetes. Es importante conocer la cantidad de carbohidratos que se pueden ingerir en cada comida sin correr ningn riesgo. Esto es diferente en cada persona. Su nutricionista puede ayudarlo a calcular la cantidad de carbohidratos que debe ingerir en cada comida y en cada refrigerio. Entre los alimentos que contienen carbohidratos, se incluyen:  Pan, cereal, arroz, pastas y galletas.  Papas y maz.  Guisantes, frijoles y lentejas.  Leche y yogur.  Frutas y jugo.  Postres, como pasteles, galletas, helado y caramelos. Cmo me afecta el alcohol? El alcohol puede provocar disminuciones sbitas de la glucemia (hipoglucemia), especialmente si utiliza insulina o toma determinados medicamentos por va oral para la diabetes. La hipoglucemia es una afeccin potencialmente mortal. Los sntomas de la hipoglucemia (somnolencia, mareos y confusin) son similares a los sntomas de haber consumido demasiado alcohol. Si el mdico afirma que el alcohol es seguro para usted, siga estas pautas:    Limite el consumo de alcohol a no ms de 1medida por da si es mujer y no est embarazada, y a 2medidas si es hombre. Una medida equivale a 12oz (355ml) de cerveza, 5oz (148ml) de vino o  1oz (44ml) de bebidas alcohlicas de alta graduacin.  No beba con el estmago vaco.  Mantngase hidratado bebiendo agua, refrescos dietticos o t helado sin azcar.  Tenga en cuenta que los refrescos comunes, los jugos y otras bebida para mezclar pueden contener mucha azcar y se deben contar como carbohidratos. Cules son algunos consejos para seguir este plan?  Leer las etiquetas de los alimentos  Comience por leer el tamao de la porcin en la "Informacin nutricional" en las etiquetas de los alimentos envasados y las bebidas. La cantidad de caloras, carbohidratos, grasas y otros nutrientes mencionados en la etiqueta se basan en una porcin del alimento. Muchos alimentos contienen ms de una porcin por envase.  Verifique la cantidad total de gramos (g) de carbohidratos totales en una porcin. Puede calcular la cantidad de porciones de carbohidratos al dividir el total de carbohidratos por 15. Por ejemplo, si un alimento tiene un total de 30g de carbohidratos, equivale a 2 porciones de carbohidratos.  Verifique la cantidad de gramos (g) de grasas saturadas y grasas trans en una porcin. Escoja alimentos que no contengan grasa o que tengan un bajo contenido.  Verifique la cantidad de miligramos (mg) de sal (sodio) en una porcin. La mayora de las personas deben limitar la ingesta de sodio total a menos de 2300mg por da.  Siempre consulte la informacin nutricional de los alimentos etiquetados como "con bajo contenido de grasa" o "sin grasa". Estos alimentos pueden tener un mayor contenido de azcar agregada o carbohidratos refinados, y deben evitarse.  Hable con su nutricionista para identificar sus objetivos diarios en cuanto a los nutrientes mencionados en la etiqueta. Al ir de compras  Evite comprar alimentos procesados, enlatados o precocinados. Estos alimentos tienden a tener una mayor cantidad de grasa, sodio y azcar agregada.  Compre en la zona exterior de la tienda de  comestibles. Esta zona incluye frutas y verduras frescas, granos a granel, carnes frescas y productos lcteos frescos. Al cocinar  Utilice mtodos de coccin a baja temperatura, como hornear, en lugar de mtodos de coccin a alta temperatura, como frer en abundante aceite.  Cocine con aceites saludables, como el aceite de oliva, canola o girasol.  Evite cocinar con manteca, crema o carnes con alto contenido de grasa. Planificacin de las comidas  Coma las comidas y los refrigerios regularmente, preferentemente a la misma hora todos los das. Evite pasar largos perodos de tiempo sin comer.  Consuma alimentos ricos en fibra, como frutas frescas, verduras, frijoles y cereales integrales. Consulte a su nutricionista sobre cuntas porciones de carbohidratos puede consumir en cada comida.  Consuma entre 4 y 6 onzas (oz) de protenas magras por da, como carnes magras, pollo, pescado, huevos o tofu. Una onza de protena magra equivale a: ? 1 onza de carne, pollo o pescado. ? 1huevo. ?  taza de tofu.  Coma algunos alimentos por da que contengan grasas saludables, como aguacates, frutos secos, semillas y pescado. Estilo de vida  Controle su nivel de glucemia con regularidad.  Haga actividad fsica habitualmente como se lo haya indicado el mdico. Esto puede incluir lo siguiente: ? 150minutos semanales de ejercicio de intensidad moderada o alta. Esto podra incluir caminatas dinmicas, ciclismo o gimnasia acutica. ? Realizar ejercicios de elongacin y de fortalecimiento, como yoga o levantamiento   de pesas, por lo menos 2veces por semana.  Tome los medicamentos como se lo haya indicado el mdico.  No consuma ningn producto que contenga nicotina o tabaco, como cigarrillos y cigarrillos electrnicos. Si necesita ayuda para dejar de fumar, consulte al mdico.  Trabaje con un asesor o instructor en diabetes para identificar estrategias para controlar el estrs y cualquier desafo emocional  y social. Preguntas para hacerle al mdico  Es necesario que consulte a un instructor en el cuidado de la diabetes?  Es necesario que me rena con un nutricionista?  A qu nmero puedo llamar si tengo preguntas?  Cules son los mejores momentos para controlar la glucemia? Dnde encontrar ms informacin:  Asociacin Estadounidense de la Diabetes (American Diabetes Association): diabetes.org  Academia de Nutricin y Diettica (Academy of Nutrition and Dietetics): www.eatright.org  Instituto Nacional de la Diabetes y las Enfermedades Digestivas y Renales (National Institute of Diabetes and Digestive and Kidney Diseases, NIH): www.niddk.nih.gov Resumen  Un plan de alimentacin saludable lo ayudar a controlar la glucemia y mantener un estilo de vida saludable.  Trabajar con un especialista en dietas y nutricin (nutricionista) puede ayudarlo a elaborar el mejor plan de alimentacin para usted.  Tenga en cuenta que los carbohidratos (hidratos de carbono) y el alcohol tienen efectos inmediatos en sus niveles de glucemia. Es importante contar los carbohidratos que ingiere y consumir alcohol con prudencia. Esta informacin no tiene como fin reemplazar el consejo del mdico. Asegrese de hacerle al mdico cualquier pregunta que tenga. Document Released: 07/21/2007 Document Revised: 12/22/2016 Document Reviewed: 08/03/2016 Elsevier Patient Education  2020 [employer]  

## 2019-01-04 NOTE — Progress Notes (Signed)
Tanya Cruz 43 y.o.   Chief Complaint  Patient presents with   Fall    injury to LEFT hand 2nd finger 5 days ago and pain    HISTORY OF PRESENT ILLNESS: This is a 43 y.o. female seen by me on 09/27/2018 and diagnosed with diabetes.  Was started on metformin 500 mg twice a day but unable to tolerate it.  Has been feeling fatigued and also complaining of intermittent blurred vision. Has also been complaining of intermittent left-sided chest pain for the past several months with normal EKGs.  No specific triggers.  No changes with exertion.  No associated symptoms.  Occasional palpitations but no lightheadedness or syncope.  No cardiac history.  Non-smoker.  No significant cardiac family history. Also had minor injury to left hand 8 days ago complaining of pain to left index finger but no obvious abnormalities and full function. HPI   Prior to Admission medications   Medication Sig Start Date End Date Taking? Authorizing Provider  CALCIUM PO Take by mouth daily.   Yes [provider]  cholecalciferol (VITAMIN D3) 25 MCG (1000 UT) tablet Take 1,000 Units by mouth daily.   Yes [provider]  Multiple Vitamin (MULTIVITAMIN) tablet Take 1 tablet by mouth daily.   Yes [provider]  docusate sodium (COLACE) 50 MG capsule Take 1 capsule (50 mg total) by mouth 2 (two) times daily. Patient not taking: Reported on 09/27/2018 06/08/18   Wieters, Hallie C, PA-C  famotidine (PEPCID) 20 MG tablet Take 1 tablet (20 mg total) by mouth 2 (two) times daily. Patient not taking: Reported on 09/27/2018 06/25/18   Lorre NickAllen, Anthony, MD  Ferrous Sulfate (IRON PO) Take 0.5 tablets by mouth daily.    [provider]  metFORMIN (GLUCOPHAGE) 500 MG tablet Take 1 tablet (500 mg total) by mouth 2 (two) times daily with a meal. Patient not taking: Reported on 01/04/2019 09/29/18   Georgina QuintSagardia, Muriel Hannold Jose, MD  omeprazole (PRILOSEC) 40 MG capsule Take 1 capsule (40 mg total) by mouth  daily. Patient not taking: Reported on 01/04/2019 09/27/18   Georgina QuintSagardia, Luverta Korte Jose, MD  rifampin (RIFADIN) 300 MG capsule Take 2 capsules (600 mg total) by mouth daily. Patient not taking: Reported on 09/27/2018 06/06/18   Gardiner Barefootomer, Robert W, MD  sucralfate (CARAFATE) 1 g tablet Take 1 tablet (1 g total) by mouth 4 (four) times daily. Patient not taking: Reported on 09/27/2018 06/25/18   Lorre NickAllen, Anthony, MD    No Known Allergies  Patient Active Problem List   Diagnosis Date Noted   History of gastroesophageal reflux (GERD) 05/06/2018    Past Medical History:  Diagnosis Date   GERD (gastroesophageal reflux disease)    Tuberculosis    Patient states she went for a second opinion and was told tshe did not have TB    Past Surgical History:  Procedure Laterality Date   CESAREAN SECTION     x2   CESAREAN SECTION  04/04/2012   Procedure: CESAREAN SECTION;  Surgeon: Tereso NewcomerUgonna A Anyanwu, MD;  Location: [facility];  Service: Obstetrics;  Laterality: N/A;   FOOT FRACTURE SURGERY      Social History   Socioeconomic History   Marital status: Married    Spouse name: Not on file   Number of children: 4   Years of education: Not on file   Highest education level: Not on file  Occupational History   Not on file  Social Needs   Financial resource strain: Not on file  Food insecurity    Worry: Not on file    Inability: Not on file   Transportation needs    Medical: Not on file    Non-medical: Not on file  Tobacco Use   Smoking status: Never Smoker   Smokeless tobacco: Never Used  Substance and Sexual Activity   Alcohol use: Never    Frequency: Never   Drug use: Never   Sexual activity: Yes  Lifestyle   Physical activity    Days per week: Not on file    Minutes per session: Not on file   Stress: Not on file  Relationships   Social connections    Talks on phone: Not on file    Gets together: Not on file    Attends religious service: Not on file    Active member of club  or organization: Not on file    Attends meetings of clubs or organizations: Not on file    Relationship status: Not on file   Intimate partner violence    Fear of current or ex partner: Not on file    Emotionally abused: Not on file    Physically abused: Not on file    Forced sexual activity: Not on file  Other Topics Concern   Not on file  Social History Narrative   Not on file    Family History  Problem Relation Age of Onset   Hyperlipidemia Mother    Diabetes Father    Diabetes Paternal Grandmother    Healthy Son    Healthy Son    Healthy Son    Healthy Daughter      Review of Systems  Constitutional: Positive for malaise/fatigue. Negative for chills and fever.  HENT: Negative.  Negative for congestion and sore throat.   Eyes: Positive for blurred vision.  Respiratory: Negative.  Negative for cough and shortness of breath.   Cardiovascular: Positive for chest pain and palpitations. Negative for leg swelling.  Gastrointestinal: Negative.  Negative for abdominal pain, blood in stool, diarrhea, nausea and vomiting.  Genitourinary: Negative.   Musculoskeletal: Negative.   Skin: Negative.   Neurological: Negative.  Negative for dizziness and headaches.  Endo/Heme/Allergies: Negative.   All other systems reviewed and are negative.  Vitals:   01/04/19 1528  BP: 118/79  Pulse: 67  Resp: 16  Temp: 99 F (37.2 C)  SpO2: 98%     Physical Exam Vitals signs reviewed.  Constitutional:      Appearance: Normal appearance. She is obese.  HENT:     Head: Normocephalic.  Eyes:     Extraocular Movements: Extraocular movements intact.     Conjunctiva/sclera: Conjunctivae normal.     Pupils: Pupils are equal, round, and reactive to light.  Neck:     Musculoskeletal: Normal range of motion and neck supple.  Cardiovascular:     Rate and Rhythm: Normal rate and regular rhythm.     Pulses: Normal pulses.     Heart sounds: Normal heart sounds.  Pulmonary:      Effort: Pulmonary effort is normal.     Breath sounds: Normal breath sounds.  Abdominal:     Palpations: Abdomen is soft.     Tenderness: There is no abdominal tenderness.  Musculoskeletal: Normal range of motion.     Comments: Left hand: No obvious injuries.  Fingers within normal limits and full range of motion.  No signs of fracture.  Skin:    General: Skin is warm and dry.     Capillary Refill:  Capillary refill takes less than 2 seconds.  Neurological:     General: No focal deficit present.     Mental Status: She is alert and oriented to person, place, and time.  Psychiatric:        Mood and Affect: Mood normal.        Behavior: Behavior normal.    Results for orders placed or performed in visit on 01/04/19 (from the past 24 hour(s))  POCT glucose (manual entry)     Status: Abnormal   Collection Time: 01/04/19  4:15 PM  Result Value Ref Range   POC Glucose 140 (A) 70 - 99 mg/dl  POCT glycosylated hemoglobin (Hb A1C)     Status: Abnormal   Collection Time: 01/04/19  4:15 PM  Result Value Ref Range   Hemoglobin A1C 6.5 (A) 4.0 - 5.6 %   HbA1c POC (<> result, manual entry)     HbA1c, POC (prediabetic range)     HbA1c, POC (controlled diabetic range)       ASSESSMENT & PLAN: Johnasia was seen today for fall.  Diagnoses and all orders for this visit:  Type 2 diabetes mellitus with hyperglycemia, without long-term current use of insulin (HCC) -     Flu Vaccine QUAD 36+ mos IM -     POCT glucose (manual entry) -     POCT glycosylated hemoglobin (Hb A1C) -     glipiZIDE (GLUCOTROL) 5 MG tablet; Take 1 tablet (5 mg total) by mouth 2 (two) times daily before a meal.  Nonspecific chest pain -     Ambulatory referral to Cardiology  Injury of finger of left hand, initial encounter Comments: Minor  Other orders -     Comprehensive metabolic panel    Patient Instructions       If you have lab work done today you will be contacted with your lab results within the next  2 weeks.  If you have not heard from [place] then please contact us. The fastest way to get your results is to register for My Chart.   IF you received an x-ray today, you will receive an invoice from [facility]. Please contact [facility] at [phone] with questions or concerns regarding your invoice.   IF you received labwork today, you will receive an invoice from [facility]. Please contact [facility] at [phone] with questions or concerns regarding your invoice.   Our billing staff will not be able to assist you with questions regarding bills from these companies.  You will be contacted with the lab results as soon as they are available. The fastest way to get your results is to activate your My Chart account. Instructions are located on the last page of this paperwork. If you have not heard from [place] regarding the results in 2 weeks, please contact this office.     Diabetes mellitus y nutricin, en adultos Diabetes Mellitus and Nutrition, Adult Si sufre de diabetes (diabetes mellitus), es muy importante tener hbitos alimenticios saludables debido a que sus niveles de Designer, television/film set sangre (glucosa) se ven afectados en gran medida por lo que come y bebe. [place] alimentos saludables en las cantidades [place], aproximadamente a la [employer], [place] ayudar a:  Aeronautical engineer glucemia.  Disminuir el riesgo de sufrir una enfermedad cardaca.  Mejorar la presin arterial.  Science writer o mantener un peso saludable. Todas las personas que sufren de diabetes son diferentes y cada una tiene necesidades diferentes en cuanto a un plan de alimentacin.  El mdico puede recomendarle que trabaje con un especialista en dietas y nutricin (nutricionista) para Tax adviser plan para usted. Su plan de alimentacin puede variar segn factores como:  Las caloras que necesita.  Los medicamentos que toma.  Su peso.  Sus niveles de glucemia, presin arterial y  colesterol.  Su nivel de [place].  Otras afecciones que tenga, como enfermedades cardacas o renales. Cmo me afectan los carbohidratos? Los carbohidratos, o hidratos de carbono, afectan su nivel de glucemia ms que cualquier otro tipo de alimento. La ingesta de carbohidratos naturalmente aumenta la cantidad de [employer]. El recuento de carbohidratos es un mtodo destinado a Midwife un registro de la cantidad de carbohidratos que se consumen. El recuento de carbohidratos es importante para Pharmacologist la glucemia a un nivel saludable, especialmente si utiliza insulina o toma determinados medicamentos por va oral para la diabetes. Es importante conocer la cantidad de carbohidratos que se pueden ingerir en cada comida sin correr Surveyor, minerals. Esto es Government social research officer. Su nutricionista puede ayudarlo a calcular la cantidad de carbohidratos que debe ingerir en cada comida y en cada refrigerio. Entre los alimentos que contienen carbohidratos, se incluyen:  Pan, cereal, arroz, pastas y galletas.  Papas y maz.  Guisantes, frijoles y lentejas.  Leche y Dentist.  Nils Pyle y [place].  Postres, como pasteles, galletas, helado y caramelos. Cmo me afecta el alcohol? El alcohol puede provocar disminuciones sbitas de la glucemia (hipoglucemia), especialmente si utiliza insulina o toma determinados medicamentos por va oral para la diabetes. La hipoglucemia es una afeccin potencialmente mortal. Los sntomas de la hipoglucemia (somnolencia, mareos y confusin) son similares a los sntomas de haber consumido demasiado alcohol. Si el mdico afirma que el alcohol es seguro para usted, [place] estas pautas:  Limite el consumo de alcohol a no ms de por da si es mujer y no est [place], y a si es hombre. Una medida equivale a 12oz ( ) de cerveza, 5oz ( ) de vino o 1oz (44ml) de bebidas alcohlicas de alta graduacin.  No beba con el estmago  vaco.  Mantngase hidratado bebiendo agua, refrescos dietticos o t helado sin azcar.  Tenga en cuenta que los refrescos comunes, los jugos y otras bebida para Engineer, manufacturing pueden contener mucha azcar y se deben contar como carbohidratos. Cules son algunos consejos para seguir este plan?  Leer las etiquetas de los alimentos  Comience por leer el tamao de la porcin en la Informacin nutricional en las etiquetas de los alimentos envasados y las bebidas. La cantidad de caloras, carbohidratos, grasas y otros nutrientes mencionados en la etiqueta se basan en una porcin del alimento. Muchos alimentos contienen ms de una porcin por envase.  Verifique la cantidad total de gramos (g) de carbohidratos totales en una porcin. Puede calcular la cantidad de porciones de carbohidratos al dividir el total de carbohidratos por 15. Por ejemplo, si un alimento tiene un total de 30g de carbohidratos, equivale a 2 porciones de carbohidratos.  Verifique la cantidad de gramos (g) de grasas saturadas y grasas trans en una porcin. Escoja alimentos que no contengan grasa o que tengan un bajo contenido.  Verifique la cantidad de miligramos (mg) de sal (sodio) en una porcin. La mayora de las personas deben limitar la ingesta de sodio total a menos de 2300mg  por Futures trader.  Siempre consulte la informacin nutricional de los alimentos etiquetados como con bajo contenido de [place] o sin grasa. Estos alimentos pueden tener un mayor contenido de Engineer, mining  o carbohidratos refinados, y deben evitarse.  Hable con su nutricionista para identificar sus objetivos diarios en cuanto a los nutrientes mencionados en la etiqueta. Al ir de compras  Evite comprar alimentos procesados, enlatados o precocinados. Estos alimentos tienden a Counselling psychologist mayor cantidad de [place], sodio y azcar agregada.  Compre en la zona exterior de la tienda de comestibles. Esta zona incluye frutas y verduras frescas, granos a granel, carnes  frescas y productos lcteos frescos. Al cocinar  Utilice mtodos de coccin a baja temperatura, como hornear, en lugar de mtodos de coccin a alta temperatura, como frer en abundante aceite.  Cocine con aceites saludables, como el aceite de [place], canola o [place].  Evite cocinar con manteca, crema o carnes con alto contenido de grasa. Planificacin de las comidas  Coma las comidas y los refrigerios regularmente, preferentemente a la misma hora todos [place]. Evite pasar largos perodos de tiempo sin comer.  Consuma alimentos ricos en fibra, como frutas frescas, verduras, frijoles y cereales integrales. Consulte a su nutricionista sobre cuntas porciones de carbohidratos puede consumir en cada comida.  Consuma entre 4 y 6 onzas (oz) de protenas magras por da, como carnes [place], pollo, pescado, huevos o tofu. Una onza de protena magra equivale a: ? 1 onza de carne, pollo o pescado. ? 1huevo. ?  taza de tofu.  Coma algunos alimentos por da que contengan grasas saludables, como aguacates, frutos secos, semillas y pescado. Estilo de vida  Controle su nivel de glucemia con regularidad.  Haga actividad fsica habitualmente como se lo haya indicado el mdico. Esto puede incluir lo siguiente: ? semanales de ejercicio de intensidad moderada o alta. Esto podra incluir caminatas dinmicas, ciclismo o gimnasia acutica. ? Realizar ejercicios de elongacin y de fortalecimiento, como yoga o levantamiento de pesas, por lo menos 2veces por semana.  Tome los [employer] se lo haya indicado el mdico.  No consuma ningn producto que contenga nicotina o tabaco, como cigarrillos y Administrator, Civil Service. Si necesita ayuda para dejar de fumar, consulte al CIGNA con un asesor o instructor en diabetes para identificar estrategias para controlar el estrs y cualquier desafo emocional y social. Preguntas para hacerle al mdico  Es necesario que consulte a IT trainer en el cuidado de la diabetes?  Es necesario que me rena con un nutricionista?  A qu nmero puedo llamar si tengo preguntas?  Cules son los mejores momentos para controlar la glucemia? Dnde encontrar ms informacin:  Asociacin Estadounidense de la Diabetes (American Diabetes Association): diabetes.org  Academia de Nutricin y Pension scheme manager (Academy of Nutrition and Dietetics): www.eatright.org  [employer] de la Diabetes y las Enfermedades Digestivas y [place] [facility] of Diabetes and Digestive and Kidney Diseases, NIH): CarFlippers.tn Resumen  Un plan de alimentacin saludable lo ayudar a Scientist, physiological glucemia y Pharmacologist un estilo de vida saludable.  Trabajar con un especialista en dietas y nutricin (nutricionista) puede ayudarlo a Designer, television/film set de alimentacin para usted.  Tenga en cuenta que los carbohidratos (hidratos de carbono) y el alcohol tienen efectos inmediatos en sus niveles de glucemia. Es importante contar los carbohidratos que ingiere y consumir alcohol con prudencia. Esta informacin no tiene Theme park manager el consejo del mdico. Asegrese de hacerle al mdico cualquier pregunta que tenga. Document Released: 07/21/2007 Document Revised: 12/22/2016 Document Reviewed: 08/03/2016 Elsevier Patient Education  2020 Elsevier Inc.      Edwina Barth, MD [facility]

## 2019-01-05 LAB — COMPREHENSIVE METABOLIC PANEL
ALT: 49 IU/L — ABNORMAL HIGH (ref 0–32)
AST: 33 IU/L (ref 0–40)
Albumin/Globulin Ratio: 1.3 (ref 1.2–2.2)
Albumin: 4.3 g/dL (ref 3.8–4.8)
Alkaline Phosphatase: 85 IU/L (ref 39–117)
BUN/Creatinine Ratio: 19 (ref 9–23)
BUN: 14 mg/dL (ref 6–24)
Bilirubin Total: 0.5 mg/dL (ref 0.0–1.2)
CO2: 23 mmol/L (ref 20–29)
Calcium: 9 mg/dL (ref 8.7–10.2)
Chloride: 100 mmol/L (ref 96–106)
Creatinine, Ser: 0.74 mg/dL (ref 0.57–1.00)
GFR calc Af Amer: 113 mL/min/{1.73_m2} (ref >59)
GFR calc non Af Amer: 98 mL/min/{1.73_m2} (ref >59)
Globulin, Total: 3.3 g/dL (ref 1.5–4.5)
Glucose: 143 mg/dL — ABNORMAL HIGH (ref 65–99)
Potassium: 3.8 mmol/L (ref 3.5–5.2)
Sodium: 137 mmol/L (ref 134–144)
Total Protein: 7.6 g/dL (ref 6.0–8.5)

## 2019-01-13 ENCOUNTER — Telehealth: Payer: Self-pay | Admitting: Emergency Medicine

## 2019-01-13 NOTE — Telephone Encounter (Signed)
Patient would like a referral to Cardio

## 2019-01-13 NOTE — Telephone Encounter (Signed)
Referral has been sent.

## 2019-01-16 ENCOUNTER — Other Ambulatory Visit: Payer: Self-pay

## 2019-01-16 ENCOUNTER — Encounter: Payer: Self-pay | Admitting: Advanced Practice Midwife

## 2019-01-16 ENCOUNTER — Other Ambulatory Visit: Payer: Self-pay | Admitting: Advanced Practice Midwife

## 2019-01-16 ENCOUNTER — Ambulatory Visit (INDEPENDENT_AMBULATORY_CARE_PROVIDER_SITE_OTHER): Payer: BC Managed Care – PPO | Admitting: Advanced Practice Midwife

## 2019-01-16 VITALS — BP 129/78 | HR 103 | Wt 199.0 lb

## 2019-01-16 DIAGNOSIS — A159 Respiratory tuberculosis unspecified: Secondary | ICD-10-CM | POA: Insufficient documentation

## 2019-01-16 DIAGNOSIS — Z01419 Encounter for gynecological examination (general) (routine) without abnormal findings: Secondary | ICD-10-CM | POA: Diagnosis not present

## 2019-01-16 DIAGNOSIS — Z124 Encounter for screening for malignant neoplasm of cervix: Secondary | ICD-10-CM

## 2019-01-16 DIAGNOSIS — Z1151 Encounter for screening for human papillomavirus (HPV): Secondary | ICD-10-CM | POA: Diagnosis not present

## 2019-01-16 DIAGNOSIS — M3500 Sicca syndrome, unspecified: Secondary | ICD-10-CM | POA: Insufficient documentation

## 2019-01-16 DIAGNOSIS — Z113 Encounter for screening for infections with a predominantly sexual mode of transmission: Secondary | ICD-10-CM | POA: Diagnosis not present

## 2019-01-16 DIAGNOSIS — N898 Other specified noninflammatory disorders of vagina: Secondary | ICD-10-CM

## 2019-01-16 DIAGNOSIS — E119 Type 2 diabetes mellitus without complications: Secondary | ICD-10-CM | POA: Insufficient documentation

## 2019-01-16 MED ORDER — FAMOTIDINE 40 MG PO TABS: 40.0000 mg | ORAL_TABLET | Freq: Every day | ORAL | 11 refills | Status: DC

## 2019-01-16 NOTE — Progress Notes (Signed)
GYNECOLOGY ANNUAL PREVENTATIVE CARE ENCOUNTER NOTE  Subjective:   Tanya Cruz is a 43 y.o. G60P1003 female here for a routine annual gynecologic exam.  Current complaints: none.   Denies abnormal vaginal bleeding, discharge, pelvic pain, problems with intercourse or other gynecologic concerns.    Gynecologic History Patient's last menstrual period was 12/31/2018. Contraception: none Last Pap: approx 6-7 years ago. Results were: does not think she has had any abnormal pap smears.  Last mammogram: approx 4 years ago. Results were: normal per patient.   Patient has been followed by PCP. She was also sent to rheumatology. At that visit it looks like she had +TB test and + for autoimmune disorder. She followed with ID, and started on rifampin. She had side effects from that medication, and she stopped. She states that ID told her she did not have TB. She was advised to follow with rheumatology. She has not been back to rheumatology. She had a bad experience there because they are the ones that told her she had TB and she does not believe that she did have this. However she continues to have multiple complaints that could be attributed to an autoimmune disorder.   Obstetric History OB History  Gravida Para Term Preterm AB Living  4 4 1     3   SAB TAB Ectopic Multiple Live Births               # Outcome Date GA Lbr Len/2nd Weight Sex Delivery Anes PTL Lv  4 Term 04/04/12 [redacted]w[redacted]d  6 lb 9.5 oz (2.99 kg) M CS-LTranv     3 Para           2 Para           1 Para             Past Medical History:  Diagnosis Date  . GERD (gastroesophageal reflux disease)   . Tuberculosis    Patient states she went for a second opinion and was told tshe did not have TB    Past Surgical History:  Procedure Laterality Date  . CESAREAN SECTION     x2  . CESAREAN SECTION  04/04/2012   Procedure: CESAREAN SECTION;  Surgeon: Osborne Oman, MD;  Location: [facility];  Service: Obstetrics;  Laterality: N/A;   . FOOT FRACTURE SURGERY      Current Outpatient Medications on File Prior to Visit  Medication Sig Dispense Refill  . CALCIUM PO Take by mouth daily.    . cholecalciferol (VITAMIN D3) 25 MCG (1000 UT) tablet Take 1,000 Units by mouth daily.    . Ferrous Sulfate (IRON PO) Take 0.5 tablets by mouth daily.    Marland Kitchen glipiZIDE (GLUCOTROL) 5 MG tablet Take 1 tablet (5 mg total) by mouth 2 (two) times daily before a meal. 60 tablet 3  . metFORMIN (GLUCOPHAGE) 500 MG tablet Take 500 mg by mouth 2 (two) times daily with a meal.    . Multiple Vitamin (MULTIVITAMIN) tablet Take 1 tablet by mouth daily.    Marland Kitchen omeprazole (PRILOSEC) 40 MG capsule Take 1 capsule (40 mg total) by mouth daily. 30 capsule 3  . docusate sodium (COLACE) 50 MG capsule Take 1 capsule (50 mg total) by mouth 2 (two) times daily. (Patient not taking: Reported on 09/27/2018) 20 capsule 0  . famotidine (PEPCID) 20 MG tablet Take 1 tablet (20 mg total) by mouth 2 (two) times daily. (Patient not taking: Reported on 09/27/2018) 30 tablet 0  . rifampin (RIFADIN)  300 MG capsule Take 2 capsules (600 mg total) by mouth daily. (Patient not taking: Reported on 09/27/2018) 60 capsule 3  . sucralfate (CARAFATE) 1 g tablet Take 1 tablet (1 g total) by mouth 4 (four) times daily. (Patient not taking: Reported on 09/27/2018) 30 tablet 0   No current facility-administered medications on file prior to visit.     No Known Allergies  Social History   Socioeconomic History  . Marital status: Married    Spouse name: Not on file  . Number of children: 4  . Years of education: Not on file  . Highest education level: Not on file  Occupational History  . Not on file  Social Needs  . Financial resource strain: Not on file  . Food insecurity    Worry: Not on file    Inability: Not on file  . Transportation needs    Medical: Not on file    Non-medical: Not on file  Tobacco Use  . Smoking status: Never Smoker  . Smokeless tobacco: Never Used  Substance  and Sexual Activity  . Alcohol use: Never    Frequency: Never  . Drug use: Never  . Sexual activity: Yes  Lifestyle  . Physical activity    Days per week: Not on file    Minutes per session: Not on file  . Stress: Not on file  Relationships  . Social Musician on phone: Not on file    Gets together: Not on file    Attends religious service: Not on file    Active member of club or organization: Not on file    Attends meetings of clubs or organizations: Not on file    Relationship status: Not on file  . Intimate partner violence    Fear of current or ex partner: Not on file    Emotionally abused: Not on file    Physically abused: Not on file    Forced sexual activity: Not on file  Other Topics Concern  . Not on file  Social History Narrative  . Not on file    Family History  Problem Relation Age of Onset  . Hyperlipidemia Mother   . Diabetes Father   . Diabetes Paternal Grandmother   . Healthy Son   . Healthy Son   . Healthy Son   . Healthy Daughter     The following portions of the patient's history were reviewed and updated as appropriate: allergies, current medications, past family history, past medical history, past social history, past surgical history and problem list.  Review of Systems Pertinent items noted in HPI and remainder of comprehensive ROS otherwise negative.   Objective:  BP 129/78   Pulse (!) 103   Wt 199 lb (90.3 kg)   LMP 12/31/2018   BMI 35.25 kg/m  CONSTITUTIONAL: Well-developed, well-nourished female in no acute distress.  HENT:  Normocephalic, atraumatic, External right and left ear normal. Oropharynx is clear and moist EYES: Conjunctivae and EOM are normal. Pupils are equal, round, and reactive to light. No scleral icterus.  NECK: Normal range of motion, supple, no masses.  Normal thyroid.  SKIN: Skin is warm and dry. No rash noted. Not diaphoretic. No erythema. No pallor. NEUROLOGIC: Alert and oriented to person, place, and  time. Normal reflexes, muscle tone coordination. No cranial nerve deficit noted. PSYCHIATRIC: Normal mood and affect. Normal behavior. Normal judgment and thought content. CARDIOVASCULAR: Normal heart rate noted, regular rhythm RESPIRATORY: Clear to auscultation bilaterally. Effort and breath  sounds normal, no problems with respiration noted. BREASTS: Symmetric in size. No masses, skin changes, nipple drainage, or lymphadenopathy. ABDOMEN: Soft, normal bowel sounds, no distention noted.  No tenderness, rebound or guarding.  PELVIC: Normal appearing external genitalia; normal appearing vaginal mucosa and cervix.  No abnormal discharge noted.  Pap smear obtained.  Normal uterine size, no other palpable masses, no uterine or adnexal tenderness. MUSCULOSKELETAL: Normal range of motion. No tenderness.  No cyanosis, clubbing, or edema.  2+ distal pulses.   Assessment and Plan:  1. Well woman exam with routine gynecological exam - Ambulatory referral to Ophthalmology - Cytology - PAP( [place]) - MM DIGITAL SCREENING BILATERAL; Future  2. Vaginal discharge - Cervicovaginal ancillary only( [place])  3. Tuberculosis -  Stopped rifampin  -  Patient believes this was a false diagnosis   4. Sjogren's syndrome, with unspecified organ involvement (HCC) - has not followed up with rheumatology  - Patient would like referral to a new PCP  Will follow up results of pap smear and manage accordingly. Mammogram scheduled Routine preventative health maintenance measures emphasized. Please refer to After Visit Summary for other counseling recommendations.   Thressa ShellerHeather Franciszek Platten DNP, CNM  01/16/19  2:49 PM

## 2019-01-16 NOTE — Progress Notes (Signed)
States the rash went away States her stomach is always hard on left side ststes she had a issue with her sugars and its affecting her heart and eyes. hasnt had a period in a few months. Checking blood sugars but doesn't understand the numbers.  Palpations headaches and chest pains and blury vision

## 2019-01-17 LAB — CERVICOVAGINAL ANCILLARY ONLY
Bacterial Vaginitis (gardnerella): NEGATIVE
Candida Glabrata: NEGATIVE
Candida Vaginitis: NEGATIVE
Molecular Disclaimer: NEGATIVE
Molecular Disclaimer: NEGATIVE
Molecular Disclaimer: NEGATIVE
Molecular Disclaimer: NORMAL
Trichomonas: NEGATIVE

## 2019-01-18 LAB — CERVICOVAGINAL ANCILLARY ONLY
Chlamydia: NEGATIVE
Neisseria Gonorrhea: NEGATIVE

## 2019-01-19 ENCOUNTER — Telehealth: Payer: Self-pay | Admitting: Emergency Medicine

## 2019-01-19 ENCOUNTER — Encounter: Payer: Self-pay | Admitting: *Deleted

## 2019-01-19 NOTE — Telephone Encounter (Signed)
Pt is wanting a referral for gastroenterologist for GERD. I told pt she may need an appt but will ask provider first. Please advise.

## 2019-01-20 ENCOUNTER — Telehealth: Payer: Self-pay | Admitting: Family Medicine

## 2019-01-20 ENCOUNTER — Encounter: Payer: Self-pay | Admitting: Gastroenterology

## 2019-01-20 ENCOUNTER — Ambulatory Visit (INDEPENDENT_AMBULATORY_CARE_PROVIDER_SITE_OTHER): Payer: BC Managed Care – PPO | Admitting: Gastroenterology

## 2019-01-20 ENCOUNTER — Other Ambulatory Visit: Payer: Self-pay

## 2019-01-20 VITALS — BP 100/68 | HR 88 | Temp 98.7°F | Ht 59.0 in | Wt 192.4 lb

## 2019-01-20 DIAGNOSIS — R7401 Elevation of levels of liver transaminase levels: Secondary | ICD-10-CM

## 2019-01-20 DIAGNOSIS — R740 Nonspecific elevation of levels of transaminase and lactic acid dehydrogenase [LDH]: Secondary | ICD-10-CM

## 2019-01-20 DIAGNOSIS — K5900 Constipation, unspecified: Secondary | ICD-10-CM

## 2019-01-20 DIAGNOSIS — R1012 Left upper quadrant pain: Secondary | ICD-10-CM | POA: Diagnosis not present

## 2019-01-20 DIAGNOSIS — K219 Gastro-esophageal reflux disease without esophagitis: Secondary | ICD-10-CM

## 2019-01-20 NOTE — Progress Notes (Signed)
Chief Complaint: Reflux  Referring Provider:    Standley Brooking, MD   HPI:    Tanya Cruz is a 43 y.o. female with a history of diabetes, Sjogren's syndrome, ?LTBI, referred to the [facility]enterology Clinic for evaluation of reflux symptoms.  She states that she has had reflux symptoms for the last 2 years or so, and worsening lately. Characterized as regurgitation and occasional HB. +nocturnal sxs with regurgitation, pyrosis. No dysphagia. Worse with green juice with broccolli drink that she typically likes. Avoids spicy foods, tomato based sauces, etc d/t exacerbation.   Was seen at [facility] for substernal chest pain in 05/2018, started on omeprazole.  Was seen in follow-up by her PCM in 09/2018 with recurrence of symptoms despite ongoing omeprazole.  Dose increased to 40 mg/day. Had helped initially, but eventually lost efficacy. Had stopped for a time, then restarted 1 week ago.   Recently prescribed Pepcid 20 mg bid (not taking), Carafate qid (not taking), Prilosec 40 mg daily QAM.  Has a recent history of a positive TB test, was started on rifampin, which she stopped due to medication ADR.  She reports she was then told she did not have TB and no plan to restart medications for TB or LTBI.  No hematochezia, melena. Does endorse a long-standing hx of constipation, which was well controlled with vegetable/fiber juices, but constipation worse since she is no longer tolerating those d/t reflux sxs. No abdominal pain.   Labs earlier this month with mildly elevated ALT of 49, improved from 95 in 09/2018.  Otherwise normal LAE's.  Normal CBC in 09/2018.  No recent abdominal imaging for review.  No previous EGD or colonoscopy.   Past Medical History:  Diagnosis Date  . GERD (gastroesophageal reflux disease)   . High cholesterol   . Positive ANA (antinuclear antibody)   . Tuberculosis    Patient states she went for a second opinion and was told tshe did not have TB     Past Surgical History:  Procedure Laterality Date  . CESAREAN SECTION     x2  . CESAREAN SECTION  04/04/2012   Procedure: CESAREAN SECTION;  Surgeon: Tereso Newcomer, MD;  Location: [facility];  Service: Obstetrics;  Laterality: N/A;  . FOOT FRACTURE SURGERY     Family History  Problem Relation Age of Onset  . Hyperlipidemia Mother   . Diabetes Father   . Diabetes Paternal Grandmother   . Healthy Son   . Healthy Son   . Healthy Son   . Healthy Daughter   . Colon cancer Neg Hx    Social History   Tobacco Use  . Smoking status: Former Games developer  . Smokeless tobacco: Never Used  Substance Use Topics  . Alcohol use: Never    Frequency: Never  . Drug use: Never   Current Outpatient Medications  Medication Sig Dispense Refill  . CALCIUM PO Take by mouth daily.    . cholecalciferol (VITAMIN D3) 25 MCG (1000 UT) tablet Take 1,000 Units by mouth daily.    . famotidine (PEPCID) 40 MG tablet Take 1 tablet (40 mg total) by mouth at bedtime. 30 tablet 11  . glipiZIDE (GLUCOTROL) 5 MG tablet Take 1 tablet (5 mg total) by mouth 2 (two) times daily before a meal. 60 tablet 3  . metFORMIN (GLUCOPHAGE) 500 MG tablet Take 500 mg by mouth 2 (two) times daily with a meal.    . omeprazole (  PRILOSEC) 40 MG capsule Take 1 capsule (40 mg total) by mouth daily. 30 capsule 3  . Plant Sterol Stanol-Pantethine (CHOLESTOFF COMPLETE PO) Take by mouth as directed. Pt states that she takes 1-3 daily    . Multiple Vitamin (MULTIVITAMIN) tablet Take 1 tablet by mouth daily.     No current facility-administered medications for this visit.    No Known Allergies   Review of Systems: All systems reviewed and negative except where noted in HPI.     Physical Exam:    Wt Readings from Last 3 Encounters:  01/20/19 192 lb 6 oz (87.3 kg)  01/16/19 199 lb (90.3 kg)  01/04/19 203 lb (92.1 kg)    BP 100/68   Pulse 88   Temp 98.7 F (37.1 C)   Ht 4\' 11"  (1.499 m)   Wt 192 lb 6 oz (87.3 kg)   LMP  12/31/2018   BMI 38.86 kg/m  Constitutional:  Pleasant, in no acute distress. Psychiatric: Normal mood and affect. Behavior is normal. EENT: Pupils normal.  Conjunctivae are normal. No scleral icterus. Neck supple. No cervical LAD. Cardiovascular: Normal rate, regular rhythm. No edema Pulmonary/chest: Effort normal and breath sounds normal. No wheezing, rales or rhonchi. Abdominal: Soft, nondistended, nontender. Bowel sounds active throughout. There are no masses palpable. No hepatomegaly. Neurological: Alert and oriented to person place and time. Skin: Skin is warm and dry. No rashes noted.   ASSESSMENT AND PLAN;   [name] is a 43 y.o. female presenting with:  1) GERD 2) Pyrosis  -EGD to evaluate for erosive esophagitis, LES laxity, hiatal hernia, etc. -Resume omeprazole as currently prescribed - Resume antireflux lifestyle/dietary modifications -Restart famotidine qhs to observe for improvement in nocturnal symptoms  3) LUQ pain  - EGD to evaluate for mucosal/luminal etiology, PUD, gastritis -Gastric biopsies to evaluate for H. pylori  4) Constipation  - Start fiber supplement - Increase daily fluid intake - If no improvement, can consider colonoscopy to r/o additional mucosal/luminal etiology  5) Elevated ALT: -Improved from previous -Repeat liver enzymes in approximately 6 months -Given comorbidities and body habitus, suspect underlying fatty liver disease, potentially superimposed elevation from recent rifampin, which she has since stopped.  The indications, risks, and benefits of EGD were explained to the patient in detail. Risks include but are not limited to bleeding, perforation, adverse reaction to medications, and cardiopulmonary compromise. Sequelae include but are not limited to the possibility of surgery, hositalization, and mortality. The patient verbalized understanding and wished to proceed. All questions answered, referred to scheduler.  Further recommendations pending results of the exam.    Lavena Bullion, DO, FACG  01/20/2019, 9:54 AM   Rutherford Guys, MD

## 2019-01-20 NOTE — Patient Instructions (Signed)
You have been scheduled for an endoscopy. Please follow written instructions given to you at your visit today. If you use inhalers (even only as needed), please bring them with you on the day of your procedure. Your physician has requested that you go to www.startemmi.com and enter the access code given to you at your visit today. This web site gives a general overview about your procedure. However, you should still follow specific instructions given to you by our office regarding your preparation for the procedure.  Resume famotidine every night.  Continue omeprazole.   Please purchase the following medications over the counter and take as directed: Fiber supplement.  If you are age 69 or older, your body mass index should be between 23-30. Your Body mass index is 38.86 kg/m. If this is out of the aforementioned range listed, please consider follow up with your Primary Care Provider.  If you are age 42 or younger, your body mass index should be between 19-25. Your Body mass index is 38.86 kg/m. If this is out of the aformentioned range listed, please consider follow up with your Primary Care Provider.

## 2019-01-20 NOTE — Telephone Encounter (Signed)
Pt would like a cb concerning her most recent lab results. Please advise at [phone]

## 2019-01-23 NOTE — Telephone Encounter (Signed)
Referral has been placed. 

## 2019-01-23 NOTE — Telephone Encounter (Signed)
Informed of labs and referral was placed

## 2019-01-24 ENCOUNTER — Telehealth: Payer: Self-pay

## 2019-01-24 ENCOUNTER — Encounter: Payer: Self-pay | Admitting: *Deleted

## 2019-01-24 ENCOUNTER — Other Ambulatory Visit: Payer: Self-pay

## 2019-01-24 ENCOUNTER — Encounter: Payer: Self-pay | Admitting: Cardiology

## 2019-01-24 ENCOUNTER — Ambulatory Visit: Payer: BC Managed Care – PPO | Admitting: Cardiology

## 2019-01-24 VITALS — BP 120/80 | HR 82 | Wt 193.0 lb

## 2019-01-24 DIAGNOSIS — Z7189 Other specified counseling: Secondary | ICD-10-CM

## 2019-01-24 DIAGNOSIS — R079 Chest pain, unspecified: Secondary | ICD-10-CM | POA: Diagnosis not present

## 2019-01-24 NOTE — Telephone Encounter (Signed)
Pt answered  "NO" to all covid questions. °

## 2019-01-24 NOTE — Patient Instructions (Signed)
Medication Instructions:  Your Physician recommend you continue on your current medication as directed.    If you need a refill on your cardiac medications before your next appointment, please call your pharmacy.   Lab work: None  Testing/Procedures: None  Follow-Up: At [facility], you and your health needs are our priority.  As part of our continuing mission to provide you with exceptional heart care, we have created designated Provider Care Teams.  These Care Teams include your primary Cardiologist (physician) and Advanced Practice Providers (APPs -  Physician Assistants and Nurse Practitioners) who all work together to provide you with the care you need, when you need it. You will need a virtual follow up appointment in 2-3 weeks.  Please call our office 2 months in advance to schedule this appointment.  You may see Buford Dresser, MD or one of the following Advanced Practice Providers on your designated Care Team:   Rosaria Ferries, PA-C . Jory Sims, DNP, ANP

## 2019-01-24 NOTE — Telephone Encounter (Signed)
Covid-19 screening questions   Do you now or have you had a fever in the last 14 days?  Do you have any respiratory symptoms of shortness of breath or cough now or in the last 14 days?  Do you have any family members or close contacts with diagnosed or suspected Covid-19 in the past 14 days?  Have you been tested for Covid-19 and found to be positive?       

## 2019-01-24 NOTE — Progress Notes (Signed)
Cardiology Office Note:    Date:  01/24/2019   ID:  Tanya Cruz, DOB [birth year], MRN [id number]  PCP:  Rutherford Guys, MD  Cardiologist:  Buford Dresser, MD PhD  Referring MD: Horald Pollen, *   CC: new patient evaluation for chest pain  History of Present Illness:    Tanya Cruz is a 43 y.o. female with a hx of type II diabetes, GERD, obesity, positive ANA who is seen as a new consult at the request of Horald Pollen, * for the evaluation and management of chest pain.  She is here with her spouse Tanya Cruz and [place] interpreter, Consuello Bossier.  Chest pain: -Initial onset: 2-3 months -Quality: burning, goes up from chest/throat up face. Also sometimes side of stomach and left arm. Sometimes feels that head is swelling with it. -Frequency: practically every day -Duration: sometimes all day, sometimes at night -Associated symptoms: at night she feels cold, wraps up, then wakes up sweating and takes blankets off. No SOB, nausea, diaphoresis -Aggravating/alleviating factors: initially omega helped, now better with a small amount of almond mild. -Prior cardiac history: none -Prior ECG: NSR -Prior workup: none -Prior treatment: none -Alcohol: never -Tobacco: never -Comorbidities: obesity, type II diabetes. No HTN, HLD, no kidney disease -Exercise level: able to walk on treadmill for an hour, walked around the neighborhood for 1.5 hours yesterday without issues. Sometimes feels better in chest with walking, but sometimes head swelling stays/gets worse -Cardiac ROS: no shortness of breath, no PND, no orthopnea, no LE edema, no syncope -Family history: none that she knows of.   She saw Dr. Bryan Lemma on 01/20/19 and is pending EGD tomorrow.  Past Medical History:  Diagnosis Date  . GERD (gastroesophageal reflux disease)   . High cholesterol   . Positive ANA (antinuclear antibody)   . Tuberculosis    Patient states she went for a second  opinion and was told tshe did not have TB    Past Surgical History:  Procedure Laterality Date  . CESAREAN SECTION     x2  . CESAREAN SECTION  04/04/2012   Procedure: CESAREAN SECTION;  Surgeon: Osborne Oman, MD;  Location: [facility];  Service: Obstetrics;  Laterality: N/A;  . FOOT FRACTURE SURGERY      Current Medications: Current Outpatient Medications on File Prior to Visit  Medication Sig  . CALCIUM PO Take by mouth daily.  . cholecalciferol (VITAMIN D3) 25 MCG (1000 UT) tablet Take 1,000 Units by mouth daily.  . famotidine (PEPCID) 40 MG tablet Take 1 tablet (40 mg total) by mouth at bedtime.  Marland Kitchen glipiZIDE (GLUCOTROL) 5 MG tablet Take 1 tablet (5 mg total) by mouth 2 (two) times daily before a meal.  . metFORMIN (GLUCOPHAGE) 500 MG tablet Take 500 mg by mouth 2 (two) times daily with a meal.  . Multiple Vitamin (MULTIVITAMIN) tablet Take 1 tablet by mouth daily.  . Omega-3 Fatty Acids (FISH OIL PO) Take by mouth daily.  Marland Kitchen omeprazole (PRILOSEC) 40 MG capsule Take 1 capsule (40 mg total) by mouth daily.  . Plant Sterol Stanol-Pantethine (CHOLESTOFF COMPLETE PO) Take by mouth as directed. Pt states that she takes 1-3 daily   No current facility-administered medications on file prior to visit.      Allergies:   Patient has no known allergies.   Social History   Tobacco Use  . Smoking status: Former Research scientist (life sciences)  . Smokeless tobacco: Never Used  Substance Use Topics  . Alcohol use: Never  Frequency: Never  . Drug use: Never    Family History: The patient's family history includes Diabetes in her father and paternal grandmother; Healthy in her daughter, son, son, and son; Hyperlipidemia in her mother. There is no history of Colon cancer.  ROS:   Please see the history of present illness.  Additional pertinent ROS: Constitutional: Negative for chills, fever, night sweats, unintentional weight loss  HENT: Negative for ear pain and hearing loss.   Eyes: Negative for eye pain.  Positive for gradually worsening vision, not acute Respiratory: Negative for sputum, wheezing.  Occasional mild cough. Cardiovascular: See HPI. Gastrointestinal: Negative for melena and hematochezia.  Positive for burning chest pain Genitourinary: Negative for dysuria and hematuria.  Musculoskeletal: Negative for falls and myalgias.  Skin: Negative for itching and rash.  Neurological: Negative for focal weakness, focal sensory changes and loss of consciousness.  Endo/Heme/Allergies: Does bruise but not bleed easily.     EKGs/Labs/Other Studies Reviewed:    The following studies were reviewed today: No prior cardiac studies  EKG:  EKG is personally reviewed.  The ekg ordered today demonstrates NSR  Recent Labs: 03/30/2018: TSH 2.180 09/27/2018: Hemoglobin 13.2; Platelets 220 01/04/2019: ALT 49; BUN 14; Creatinine, Ser 0.74; Potassium 3.8; Sodium 137  Recent Lipid Panel    Component Value Date/Time   CHOL 150 09/27/2018 1535   TRIG 160 (H) 09/27/2018 1535   HDL 41 09/27/2018 1535   CHOLHDL 3.7 09/27/2018 1535   LDLCALC 77 09/27/2018 1535    Physical Exam:    VS:  BP 120/80   Pulse 82   Wt 193 lb (87.5 kg)   LMP 12/31/2018   SpO2 98%   BMI 38.98 kg/m     Wt Readings from Last 3 Encounters:  01/24/19 193 lb (87.5 kg)  01/20/19 192 lb 6 oz (87.3 kg)  01/16/19 199 lb (90.3 kg)    GEN: Well nourished, well developed in no acute distress HEENT: Normal, moist mucous membranes NECK: No JVD CARDIAC: regular rhythm, normal S1 and S2, no murmurs, rubs, gallops.  VASCULAR: Radial and DP pulses 2+ bilaterally. No carotid bruits RESPIRATORY:  Clear to auscultation without rales, wheezing or rhonchi  ABDOMEN: Soft, non-tender, non-distended MUSCULOSKELETAL:  Ambulates independently SKIN: Warm and dry, no edema NEUROLOGIC:  Alert and oriented x 3. No focal neuro deficits noted. PSYCHIATRIC:  Normal affect    ASSESSMENT:    1. Chest pain, unspecified type   2. Cardiac risk  counseling    PLAN:    Chest pain: discussed at length today, with assistance of interpreter. Overall, given her pain symptoms, exam, ECG, and low risk, I have a low suspicion that these are cardiac in nature. I agree with GI evaluation given her symptoms. She is pending EGD tomorrow.  We spent significant time today reviewing different parts of the body and how they can cause pain. We reviewed that there are different ways we evaluate these symptoms with tests. We reviewed which tests I think are most appropriate given the symptoms, and we discussed risks/benefits and limitations of each of these tests. Please see summary below. We also discussed that if testing is unrevealing for a cardiac cause of the symptoms, there are many noncardiac causes as well that can contribute to symptoms. If the heart is ruled out, then I recommend returning to PCP to discuss alternative diagnoses.  If her EGD is unremarkable, I would pursue exercise treadmill stress test to rule out cardiac involvement. We will follow up in short order  to review EGD results and symptoms, as well as to discuss next steps.  Cardiac risk counseling and prevention recommendations: -recommend heart healthy/Mediterranean diet, with whole grains, fruits, vegetable, fish, lean meats, nuts, and olive oil. Limit salt. -recommend moderate walking, 3-5 times/week for 30-50 minutes each session. Aim for at least 150 minutes.week. Goal should be pace of 3 miles/hours, or walking 1.5 miles in 30 minutes -recommend avoidance of tobacco products. Avoid excess alcohol.  -ASCVD risk score: The 10-year ASCVD risk score Denman George DC Montez Hageman., et al., 2013) is: 1.4%   Values used to calculate the score:     Age: 24 years     Sex: Female     Is Non-Hispanic African American: No     Diabetic: Yes     Tobacco smoker: No     Systolic Blood Pressure: 120 mmHg     Is BP treated: No     HDL Cholesterol: 41 mg/dL     Total Cholesterol: 150 mg/dL    Plan for  follow up: 2-3 weeks post EGD, virtual ok  TIME SPENT WITH PATIENT: >60 minutes of direct patient care. More than 50% of that time was spent on coordination of care and counseling regarding symptoms, risk factors, and management. Prolonged time needed given time for interpreter to relay information.  Jodelle Red, MD, PhD [place]  [facility]   Medication Adjustments/Labs and Tests Ordered: Current medicines are reviewed at length with the patient today.  Concerns regarding medicines are outlined above.  Orders Placed This Encounter  Procedures  . EKG 12-Lead   No orders of the defined types were placed in this encounter.   Patient Instructions  Medication Instructions:  Your Physician recommend you continue on your current medication as directed.    If you need a refill on your cardiac medications before your next appointment, please call your pharmacy.   Lab work: None  Testing/Procedures: None  Follow-Up: At [facility], you and your health needs are our priority.  As part of our continuing mission to provide you with exceptional heart care, we have created designated Provider Care Teams.  These Care Teams include your primary Cardiologist (physician) and Advanced Practice Providers (APPs -  Physician Assistants and Nurse Practitioners) who all work together to provide you with the care you need, when you need it. You will need a virtual follow up appointment in 2-3 weeks.  Please call our office 2 months in advance to schedule this appointment.  You may see Jodelle Red, MD or one of the following Advanced Practice Providers on your designated Care Team:   Theodore Demark, PA-C . Joni Reining, DNP, ANP       Signed, Jodelle Red, MD PhD 01/24/2019 12:25 PM     [facility]

## 2019-01-25 ENCOUNTER — Ambulatory Visit (AMBULATORY_SURGERY_CENTER): Payer: BC Managed Care – PPO | Admitting: Gastroenterology

## 2019-01-25 ENCOUNTER — Encounter: Payer: Self-pay | Admitting: Gastroenterology

## 2019-01-25 VITALS — BP 109/76 | HR 74 | Temp 98.8°F | Resp 12 | Ht 59.0 in | Wt 192.0 lb

## 2019-01-25 DIAGNOSIS — K219 Gastro-esophageal reflux disease without esophagitis: Secondary | ICD-10-CM | POA: Diagnosis not present

## 2019-01-25 DIAGNOSIS — K2970 Gastritis, unspecified, without bleeding: Secondary | ICD-10-CM

## 2019-01-25 DIAGNOSIS — K2950 Unspecified chronic gastritis without bleeding: Secondary | ICD-10-CM | POA: Diagnosis not present

## 2019-01-25 DIAGNOSIS — R1012 Left upper quadrant pain: Secondary | ICD-10-CM

## 2019-01-25 DIAGNOSIS — K2100 Gastro-esophageal reflux disease with esophagitis, without bleeding: Secondary | ICD-10-CM

## 2019-01-25 MED ORDER — OMEPRAZOLE 40 MG PO CPDR: DELAYED_RELEASE_CAPSULE | ORAL | 2 refills | Status: AC

## 2019-01-25 MED ORDER — SODIUM CHLORIDE 0.9 % IV SOLN: 500.0000 mL | INTRAVENOUS | Status: DC

## 2019-01-25 NOTE — Progress Notes (Signed)
Pt's states no medical or surgical changes since previsit or office visit.  KA temps, Cw vitals and BC  IV.

## 2019-01-25 NOTE — Patient Instructions (Signed)
Handout given for Gastritis.  USTED TUVO UN PROCEDIMIENTO ENDOSCPICO HOY EN EL [facility]:   Lea el informe del procedimiento que se le entreg para cualquier pregunta especfica sobre lo que se Primary school teacher.  Si el informe del examen no responde a sus preguntas, por favor llame a su gastroenterlogo para aclararlo.  Si usted solicit que no se le den [employer] de lo que se Estate manager/land agent en su procedimiento al [employer] va a cuidar, entonces el informe del procedimiento se ha incluido en un sobre sellado para que usted lo revise despus cuando le sea ms conveniente.   LO QUE PUEDE ESPERAR: Algunas sensaciones de hinchazn en el abdomen.  Puede tener ms gases de lo normal.  El caminar puede ayudarle a eliminar el aire que se le puso en el tracto gastrointestinal durante el procedimiento y reducir la hinchazn.  Si le hicieron una endoscopia inferior (como una colonoscopia o una sigmoidoscopia flexible), podra notar manchas de sangre en las heces fecales o en el papel higinico.  Si se someti a una preparacin intestinal para su procedimiento, es posible que no tenga una evacuacin intestinal normal durante [employer].   Tenga en cuenta:  Es posible que note un poco de irritacin y congestin en la nariz o algn drenaje.  Esto es debido al oxgeno [employer] durante su procedimiento.  No hay que preocuparse y esto debe desaparecer ms o Scientist, research (medical).    Fiebre de 100F o ms   Despus de la endoscopia superior (EGD)  Vmitos de Biochemist, clinical o material como caf molido   Dolor en el pecho o dolor debajo de los omplatos que antes no tena   Dolor o dificultad persistente para tragar  Falta de aire que antes no tena   Fiebre de 100F o ms  Heces fecales negras y pegajosas   Para asuntos urgentes o de Freight forwarder, puede comunicarse con un gastroenterlogo a cualquier hora llamando al [phone].  DIETA:  Recomendamos una comida pequea al principio, pero luego  puede continuar con su dieta normal.  Tome muchos lquidos, Teacher, adult education las bebidas alcohlicas durante 24 horas.    ACTIVIDAD:  Debe planear tomarse las cosas con calma por el resto del da y no debe CONDUCIR ni usar maquinaria pesada Programmer, applications (debido a los medicamentos de sedacin utilizados durante el examen).     SEGUIMIENTO: Nuestro personal llamar al nmero que aparece en su historial al siguiente da hbil de su procedimiento para ver cmo se siente y para responder cualquier pregunta o inquietud que pueda tener con respecto a la informacin que se le dio despus del procedimiento. Si no podemos contactarle, le dejaremos un mensaje.  Sin embargo, si se siente bien y no tiene Paediatric nurse, no es necesario que nos devuelva la llamada.  Asumiremos que ha regresado a sus actividades diarias normales sin incidentes. Si se le tomaron algunas biopsias, le contactaremos por telfono o por carta en las prximas 3 semanas.  Si no ha sabido [employer] biopsias en el transcurso de 3 semanas, por favor llmenos al [phone].   FIRMAS/CONFIDENCIALIDAD: Usted y/o el acompaante que le cuide han firmado documentos que se ingresarn en su historial mdico electrnico.  Estas firmas atestiguan el hecho de que la informacin anterior

## 2019-01-25 NOTE — Op Note (Signed)
[facility] Patient Name: Tanya Cruz Procedure Date: 01/25/2019 8:47 AM MRN: [id number] Endoscopist: Doristine Locks , MD Age: 43 Referring MD:  Date of Birth: 03/09/1976 Gender: Female Account #: [id number] Procedure:                Upper GI endoscopy Indications:              Abdominal pain in the left upper quadrant,                            Suspected esophageal reflux, Substernal non-cardiac                            chest pain Medicines:                Monitored Anesthesia Care Procedure:                Pre-Anesthesia Assessment:                           - Prior to the procedure, a History and Physical                            was performed, and patient medications and                            allergies were reviewed. The patient's tolerance of                            previous anesthesia was also reviewed. The risks                            and benefits of the procedure and the sedation                            options and risks were discussed with the patient.                            All questions were answered, and informed consent                            was obtained. Prior Anticoagulants: The patient has                            taken no previous anticoagulant or antiplatelet                            agents. ASA Grade Assessment: II - A patient with                            mild systemic disease. After reviewing the risks                            and benefits, the patient was deemed in  satisfactory condition to undergo the procedure.                           After obtaining informed consent, the endoscope was                            passed under direct vision. Throughout the                            procedure, the patient's blood pressure, pulse, and                            oxygen saturations were monitored continuously. The                            Endoscope was introduced through the  mouth, and                            advanced to the second part of duodenum. The upper                            GI endoscopy was accomplished without difficulty.                            The patient tolerated the procedure well. Scope In: Scope Out: Findings:                 The examined esophagus was normal.                           The Z-line was regular and was found 34 cm from the                            incisors.                           Scattered moderate inflammation characterized by                            erythema was found in the gastric fundus, in the                            gastric body and in the gastric antrum. Biopsies                            were taken with a cold forceps for Helicobacter                            pylori testing. Estimated blood loss was minimal.                            Small amounts of retained food in the antrum.                           The  duodenal bulb, first portion of the duodenum                            and second portion of the duodenum were normal. Complications:            No immediate complications. Estimated Blood Loss:     Estimated blood loss was minimal. Impression:               - Normal esophagus.                           - Z-line regular, 34 cm from the incisors.                           - Gastritis. Biopsied.                           - Normal duodenal bulb, first portion of the                            duodenum and second portion of the duodenum. Recommendation:           - Patient has a contact number available for                            emergencies. The signs and symptoms of potential                            delayed complications were discussed with the                            patient. Return to normal activities tomorrow.                            Written discharge instructions were provided to the                            patient.                           - Resume previous diet.                            - Continue present medications.                           - Await pathology results.                           - Increase Prilosec (omeprazole) to 40 mg PO BID                            for 8 weeks, then reduce to 40 mg daily to control                            reflux symptoms.                           -  If still with breakthrough reflux symptoms at                            night, take Pepcid (famotidine) 20 mg PO at bedtime.                           - Return to GI clinic in 2-3 months. Doristine Locks, MD 01/25/2019 9:05:10 AM

## 2019-01-25 NOTE — Progress Notes (Signed)
Called to room to assist during endoscopic procedure.  Patient ID and intended procedure confirmed with present staff. Received instructions for my participation in the procedure from the performing physician.  

## 2019-01-25 NOTE — Progress Notes (Signed)
To PACU, VSS. Report to [email or web address] 

## 2019-01-26 LAB — CYTOLOGY - PAP
Diagnosis: NEGATIVE
High risk HPV: NEGATIVE
Molecular Disclaimer: 56
Molecular Disclaimer: NORMAL

## 2019-01-27 ENCOUNTER — Telehealth: Payer: Self-pay | Admitting: *Deleted

## 2019-01-27 NOTE — Telephone Encounter (Signed)
  Follow up Call-  Call back number 01/25/2019  Post procedure Call Back phone  # [phone]  Permission to leave phone message Yes  Some recent data might be hidden     Patient questions:  Do you have a fever, pain , or abdominal swelling? No. Pain Score  0 *  Have you tolerated food without any problems? Yes.    Have you been able to return to your normal activities? Yes.    Do you have any questions about your discharge instructions: Diet   No. Medications  No. Follow up visit  No.  Do you have questions or concerns about your Care? No.  Actions: * If pain score is 4 or above: No action needed, pain <4.  Information provided via husband.    1. Have you developed a fever since your procedure? no  2.   Have you had an respiratory symptoms (SOB or cough) since your procedure? no  3.   Have you tested positive for COVID 19 since your procedure no  4.   Have you had any family members/close contacts diagnosed with the COVID 19 since your procedure?  no   If yes to any of these questions please route to Joylene John, RN and Alphonsa Gin, Therapist, sports.

## 2019-02-02 ENCOUNTER — Encounter: Payer: Self-pay | Admitting: Gastroenterology

## 2019-02-14 ENCOUNTER — Ambulatory Visit: Payer: BC Managed Care – PPO | Admitting: Cardiology

## 2019-02-28 ENCOUNTER — Other Ambulatory Visit: Payer: Self-pay | Admitting: Advanced Practice Midwife

## 2019-04-06 ENCOUNTER — Ambulatory Visit: Payer: BC Managed Care – PPO | Admitting: Family Medicine

## 2019-04-12 ENCOUNTER — Encounter: Payer: Self-pay | Admitting: Family Medicine

## 2019-06-22 ENCOUNTER — Ambulatory Visit: Payer: Self-pay | Attending: Internal Medicine | Admitting: Internal Medicine

## 2019-06-22 ENCOUNTER — Encounter: Payer: Self-pay | Admitting: Internal Medicine

## 2019-06-22 ENCOUNTER — Ambulatory Visit (HOSPITAL_BASED_OUTPATIENT_CLINIC_OR_DEPARTMENT_OTHER): Payer: Self-pay | Admitting: Pharmacist

## 2019-06-22 ENCOUNTER — Other Ambulatory Visit: Payer: Self-pay

## 2019-06-22 VITALS — BP 115/73 | HR 63 | Temp 98.8°F | Resp 16 | Ht 60.0 in | Wt 161.2 lb

## 2019-06-22 DIAGNOSIS — E1169 Type 2 diabetes mellitus with other specified complication: Secondary | ICD-10-CM

## 2019-06-22 DIAGNOSIS — E119 Type 2 diabetes mellitus without complications: Secondary | ICD-10-CM

## 2019-06-22 DIAGNOSIS — Z23 Encounter for immunization: Secondary | ICD-10-CM

## 2019-06-22 DIAGNOSIS — E669 Obesity, unspecified: Secondary | ICD-10-CM

## 2019-06-22 LAB — POCT GLYCOSYLATED HEMOGLOBIN (HGB A1C): HbA1c, POC (prediabetic range): 5.5 % — AB (ref 5.7–6.4)

## 2019-06-22 LAB — GLUCOSE, POCT (MANUAL RESULT ENTRY): POC Glucose: 120 mg/dl — AB (ref 70–99)

## 2019-06-22 MED ORDER — TRUE METRIX BLOOD GLUCOSE TEST VI STRP: ORAL_STRIP | 12 refills | Status: DC

## 2019-06-22 MED ORDER — TRUEPLUS LANCETS 28G MISC: 4 refills | Status: DC

## 2019-06-22 MED ORDER — TRUE METRIX METER W/DEVICE KIT: PACK | 0 refills | Status: DC

## 2019-06-22 MED ORDER — TRUEPLUS LANCETS 28G MISC: 4 refills | Status: AC

## 2019-06-22 MED ORDER — TRUE METRIX METER W/DEVICE KIT: PACK | 0 refills | Status: AC

## 2019-06-22 MED FILL — TRUE METRIX GLUCOSE TEST ST: 25 days supply | Qty: 100 | Fill #0

## 2019-06-22 MED FILL — TRUEplus LANCETS 28G MISC: 25 days supply | Qty: 100 | Fill #0

## 2019-06-22 MED FILL — !TRUE METRIX BLOOD GLUCOSE: 1 days supply | Qty: 1 | Fill #0

## 2019-06-22 NOTE — Progress Notes (Signed)
Patient presents for vaccination against strep pneumo per orders of Dr. Johnson. Consent given. Counseling provided. No contraindications exists. Vaccine administered without incident.   

## 2019-06-22 NOTE — Progress Notes (Signed)
Patient ID: Tanya Cruz, female    DOB: 1975/08/07  MRN: [id number]  CC: Gastroesophageal Reflux, Diabetes, and New Patient (Initial Visit)   Subjective: Tanya Cruz is a 44 y.o. female who presents for new pt visit.  Est care.  [name], from [place] is with her and interprets Her concerns today include:  DM, ??Sjogrens, TB latent (Dr. Linus Salmons 05/2018), GERD.  Previous PCP was at [place].  Decided to change due to loss insurance.  DM:  Was on Glipizide 5 mg BID and Metformin but d/c 3 mths ago because BS were dropping too low No battery for meter Last eye exam 1.5 mg ago at [facility].  No retinopathy Eating habits:  Eating less carbs  Exercise:  Walk daily for 1-2 hrs.  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  She was started on rifampin but she never completed the medication as she did not tolerate it.  She denies any symptoms at this time.  Past medical, surgical, family and social history reviewed.  Patient Active Problem List   Diagnosis Date Noted  . Diabetes mellitus, type II ([place]) 01/16/2019  . Tuberculosis 01/16/2019  . Sjogren's disease ([place]) 01/16/2019  . History of gastroesophageal reflux (GERD) 05/06/2018     Current Outpatient Medications on File Prior to Visit  Medication Sig Dispense Refill  . CALCIUM PO Take by mouth daily.    . cholecalciferol (VITAMIN D3) 25 MCG (1000 UT) tablet Take 1,000 Units by mouth daily.    . famotidine (PEPCID) 40 MG tablet Take 1 tablet (40 mg total) by mouth at bedtime. (Patient not taking:  Reported on 06/22/2019) 30 tablet 11  . Multiple Vitamin (MULTIVITAMIN) tablet Take 1 tablet by mouth daily.    . Omega-3 Fatty Acids (FISH OIL PO) Take by mouth daily.    Marland Kitchen omeprazole (PRILOSEC) 40 MG capsule Increase Prilosec to 40 mg twice a day for 8 weeks, then reduce to 40 mg per day to control reflux. (Patient not taking: Reported on 06/22/2019) 60 capsule 2  . Plant Sterol Stanol-Pantethine (CHOLESTOFF COMPLETE PO) Take by mouth as directed. Pt states that she takes 1-3 daily     No current facility-administered medications on file prior to visit.    No Known Allergies  Social History   Socioeconomic History  . Marital status: Married    Spouse name: Not on file  . Number of children: 4  . Years of education: Not on file  . Highest education level: Not on file  Occupational History  . Occupation: homemaker  Tobacco Use  . Smoking status: Former Research scientist (life sciences)  . Smokeless tobacco: Never Used  Substance and Sexual Activity  . Alcohol use: Never  . Drug use: Never  . Sexual activity: Yes  Other Topics Concern  . Not on file  Social History Narrative  . Not on file   Social Determinants of Health   Financial Resource Strain:   . Difficulty of Paying Living Expenses:  Not on file  Food Insecurity:   . Worried About Charity fundraiser in the Last Year: Not on file  . Ran Out of Food in the Last Year: Not on file  Transportation Needs:   . Lack of Transportation (Medical): Not on file  . Lack of Transportation (Non-Medical): Not on file  Physical Activity:   . Days of Exercise per Week: Not on file  . Minutes of Exercise per Session: Not on file  Stress:   . Feeling of Stress : Not on file  Social Connections:   . Frequency of Communication with Friends and Family: Not on file  . Frequency of Social Gatherings with Friends and Family: Not on file  . Attends Religious Services: Not on file  . Active Member of Clubs or Organizations: Not on file  . Attends Theatre manager Meetings: Not on file  . Marital Status: Not on file  Intimate Partner Violence:   . Fear of Current or Ex-Partner: Not on file  . Emotionally Abused: Not on file  . Physically Abused: Not on file  . Sexually Abused: Not on file    Family History  Problem Relation Age of Onset  . Hyperlipidemia Mother   . Diabetes Father   . Diabetes Paternal Grandmother   . Healthy Son   . Healthy Son   . Healthy Son   . Healthy Daughter   . Colon cancer Neg Hx   . Esophageal cancer Neg Hx   . Stomach cancer Neg Hx     Past Surgical History:  Procedure Laterality Date  . CESAREAN SECTION     x2  . CESAREAN SECTION  04/04/2012   Procedure: CESAREAN SECTION;  Surgeon: Osborne Oman, MD;  Location: [facility];  Service: Obstetrics;  Laterality: N/A;  . FOOT FRACTURE SURGERY      ROS: Review of Systems Negative except as stated above  PHYSICAL EXAM: BP 115/73   Pulse 63   Temp 98.8 F (37.1 C)   Resp 16   Ht 5' (1.524 m)   Wt 161 lb 3.2 oz (73.1 kg)   SpO2 98%   BMI 31.48 kg/m   Physical Exam  General appearance - alert, well appearing, and in no distress Mental status - normal mood, behavior, speech, dress, motor activity, and thought processes Neck - supple, no significant adenopathy Chest - clear to auscultation, no wheezes, rales or rhonchi, symmetric air entry Heart - normal rate, regular rhythm, normal S1, S2, no murmurs, rubs, clicks or gallops Extremities - peripheral pulses normal, no pedal edema, no clubbing or cyanosis   CMP Latest Ref Rng & Units 01/04/2019 09/27/2018 06/25/2018  Glucose 65 - 99 mg/dL 143(H) 107(H) 89  BUN 6 - 24 mg/dL '14 14 20  ' Creatinine 0.57 - 1.00 mg/dL 0.74 0.56(L) 0.96  Sodium 134 - 144 mmol/L 137 139 138  Potassium 3.5 - 5.2 mmol/L 3.8 4.0 3.7  Chloride 96 - 106 mmol/L 100 102 107  CO2 20 - 29 mmol/L 23 19(L) 24  Calcium 8.7 - 10.2 mg/dL 9.0 8.6(L) 8.5(L)  Total Protein 6.0 - 8.5 g/dL 7.6 7.2 -  Total Bilirubin 0.0 - 1.2 mg/dL  0.5 0.2 -  Alkaline Phos 39 - 117 IU/L 85 82 -  AST 0 - 40 IU/L 33 43(H) -  ALT 0 - 32 IU/L 49(H) 95(H) -   Lipid Panel     Component Value Date/Time   CHOL 150 09/27/2018 1535   TRIG 160 (H)  09/27/2018 1535   HDL 41 09/27/2018 1535   CHOLHDL 3.7 09/27/2018 1535   LDLCALC 77 09/27/2018 1535    CBC    Component Value Date/Time   WBC 6.1 09/27/2018 1535   WBC 8.2 06/25/2018 1536   RBC 4.79 09/27/2018 1535   RBC 4.65 06/25/2018 1536   HGB 13.2 09/27/2018 1535   HCT 42.2 09/27/2018 1535   PLT 220 09/27/2018 1535   MCV 88 09/27/2018 1535   MCH 27.6 09/27/2018 1535   MCH 27.7 06/25/2018 1536   MCHC 31.3 (L) 09/27/2018 1535   MCHC 31.8 06/25/2018 1536   RDW 14.8 09/27/2018 1535   LYMPHSABS 2.5 09/27/2018 1535   EOSABS 0.1 09/27/2018 1535   BASOSABS 0.0 09/27/2018 1535    ASSESSMENT AND PLAN: 1. Type 2 diabetes mellitus without complication, without long-term current use of insulin (HCC) Well-controlled.  She has been off medication for the last 3 months.  She has lost more than 40 pounds in the same time frame.  Commended her on her success.  Encouraged her to keep up the good work with healthy eating habits and regular exercise.  She would like diabetic testing supplies to check her blood sugars intermittently. - POCT glucose (manual entry) - POCT glycosylated hemoglobin (Hb A1C) - Microalbumin / creatinine urine ratio - Blood Glucose Monitoring Suppl (TRUE METRIX METER) w/Device KIT; Use as directed  Dispense: 1 kit; Refill: 0 - glucose blood (TRUE METRIX BLOOD GLUCOSE TEST) test strip; Use as instructed  Dispense: 100 each; Refill: 12 - TRUEplus Lancets 28G MISC; Use as directed  Dispense: 100 each; Refill: 4  2. Obesity (BMI 30-39.9) See #1 above  3. Need for vaccination against Streptococcus pneumoniae Given    Patient was given the opportunity to ask questions.  Patient verbalized understanding of the plan and was able to repeat key elements of the plan.    Orders Placed This Encounter  Procedures  . Microalbumin / creatinine urine ratio  . POCT glucose (manual entry)  . POCT glycosylated hemoglobin (Hb A1C)     Requested Prescriptions   Signed Prescriptions Disp Refills  . Blood Glucose Monitoring Suppl (TRUE METRIX METER) w/Device KIT 1 kit 0    Sig: Use as directed  . glucose blood (TRUE METRIX BLOOD GLUCOSE TEST) test strip 100 each 12    Sig: Use as instructed  . TRUEplus Lancets 28G MISC 100 each 4    Sig: Use as directed    Return in about 4 months (around 10/20/2019).  Karle Plumber, MD, FACP

## 2019-06-22 NOTE — Patient Instructions (Signed)
Diabetes mellitus y nutricin, en adultos Diabetes Mellitus and Nutrition, Adult Si sufre de diabetes (diabetes mellitus), es muy importante tener hbitos alimenticios saludables debido a que sus niveles de azcar en la sangre (glucosa) se ven afectados en gran medida por lo que come y bebe. Comer alimentos saludables en las cantidades adecuadas, aproximadamente a la misma hora todos los das, lo ayudar a:  Controlar la glucemia.  Disminuir el riesgo de sufrir una enfermedad cardaca.  Mejorar la presin arterial.  Alcanzar o mantener un peso saludable. Todas las personas que sufren de diabetes son diferentes y cada una tiene necesidades diferentes en cuanto a un plan de alimentacin. El mdico puede recomendarle que trabaje con un especialista en dietas y nutricin (nutricionista) para elaborar el mejor plan para usted. Su plan de alimentacin puede variar segn factores como:  Las caloras que necesita.  Los medicamentos que toma.  Su peso.  Sus niveles de glucemia, presin arterial y colesterol.  Su nivel de actividad.  Otras afecciones que tenga, como enfermedades cardacas o renales. Cmo me afectan los carbohidratos? Los carbohidratos, o hidratos de carbono, afectan su nivel de glucemia ms que cualquier otro tipo de alimento. La ingesta de carbohidratos naturalmente aumenta la cantidad de glucosa en la sangre. El recuento de carbohidratos es un mtodo destinado a llevar un registro de la cantidad de carbohidratos que se consumen. El recuento de carbohidratos es importante para mantener la glucemia a un nivel saludable, especialmente si utiliza insulina o toma determinados medicamentos por va oral para la diabetes. Es importante conocer la cantidad de carbohidratos que se pueden ingerir en cada comida sin correr ningn riesgo. Esto es diferente en cada persona. Su nutricionista puede ayudarlo a calcular la cantidad de carbohidratos que debe ingerir en cada comida y en cada  refrigerio. Entre los alimentos que contienen carbohidratos, se incluyen:  Pan, cereal, arroz, pastas y galletas.  Papas y maz.  Guisantes, frijoles y lentejas.  Leche y yogur.  Frutas y jugo.  Postres, como pasteles, galletas, helado y caramelos. Cmo me afecta el alcohol? El alcohol puede provocar disminuciones sbitas de la glucemia (hipoglucemia), especialmente si utiliza insulina o toma determinados medicamentos por va oral para la diabetes. La hipoglucemia es una afeccin potencialmente mortal. Los sntomas de la hipoglucemia (somnolencia, mareos y confusin) son similares a los sntomas de haber consumido demasiado alcohol. Si el mdico afirma que el alcohol es seguro para usted, siga estas pautas:  Limite el consumo de alcohol a no ms de 1medida por da si es mujer y no est embarazada, y a 2medidas si es hombre. Una medida equivale a 12oz (355ml) de cerveza, 5oz (148ml) de vino o 1oz (44ml) de bebidas alcohlicas de alta graduacin.  No beba con el estmago vaco.  Mantngase hidratado bebiendo agua, refrescos dietticos o t helado sin azcar.  Tenga en cuenta que los refrescos comunes, los jugos y otras bebida para mezclar pueden contener mucha azcar y se deben contar como carbohidratos. Cules son algunos consejos para seguir este plan?  Leer las etiquetas de los alimentos  Comience por leer el tamao de la porcin en la "Informacin nutricional" en las etiquetas de los alimentos envasados y las bebidas. La cantidad de caloras, carbohidratos, grasas y otros nutrientes mencionados en la etiqueta se basan en una porcin del alimento. Muchos alimentos contienen ms de una porcin por envase.  Verifique la cantidad total de gramos (g) de carbohidratos totales en una porcin. Puede calcular la cantidad de porciones de carbohidratos al dividir el   total de carbohidratos por 15. Por ejemplo, si un alimento tiene un total de 30g de carbohidratos, equivale a 2  porciones de carbohidratos.  Verifique la cantidad de gramos (g) de grasas saturadas y grasas trans en una porcin. Escoja alimentos que no contengan grasa o que tengan un bajo contenido.  Verifique la cantidad de miligramos (mg) de sal (sodio) en una porcin. La mayora de las personas deben limitar la ingesta de sodio total a menos de 2300mg por da.  Siempre consulte la informacin nutricional de los alimentos etiquetados como "con bajo contenido de grasa" o "sin grasa". Estos alimentos pueden tener un mayor contenido de azcar agregada o carbohidratos refinados, y deben evitarse.  Hable con su nutricionista para identificar sus objetivos diarios en cuanto a los nutrientes mencionados en la etiqueta. Al ir de compras  Evite comprar alimentos procesados, enlatados o precocinados. Estos alimentos tienden a tener una mayor cantidad de grasa, sodio y azcar agregada.  Compre en la zona exterior de la tienda de comestibles. Esta zona incluye frutas y verduras frescas, granos a granel, carnes frescas y productos lcteos frescos. Al cocinar  Utilice mtodos de coccin a baja temperatura, como hornear, en lugar de mtodos de coccin a alta temperatura, como frer en abundante aceite.  Cocine con aceites saludables, como el aceite de oliva, canola o girasol.  Evite cocinar con manteca, crema o carnes con alto contenido de grasa. Planificacin de las comidas  Coma las comidas y los refrigerios regularmente, preferentemente a la misma hora todos los das. Evite pasar largos perodos de tiempo sin comer.  Consuma alimentos ricos en fibra, como frutas frescas, verduras, frijoles y cereales integrales. Consulte a su nutricionista sobre cuntas porciones de carbohidratos puede consumir en cada comida.  Consuma entre 4 y 6 onzas (oz) de protenas magras por da, como carnes magras, pollo, pescado, huevos o tofu. Una onza de protena magra equivale a: ? 1 onza de carne, pollo o  pescado. ? 1huevo. ?  taza de tofu.  Coma algunos alimentos por da que contengan grasas saludables, como aguacates, frutos secos, semillas y pescado. Estilo de vida  Controle su nivel de glucemia con regularidad.  Haga actividad fsica habitualmente como se lo haya indicado el mdico. Esto puede incluir lo siguiente: ? 150minutos semanales de ejercicio de intensidad moderada o alta. Esto podra incluir caminatas dinmicas, ciclismo o gimnasia acutica. ? Realizar ejercicios de elongacin y de fortalecimiento, como yoga o levantamiento de pesas, por lo menos 2veces por semana.  Tome los medicamentos como se lo haya indicado el mdico.  No consuma ningn producto que contenga nicotina o tabaco, como cigarrillos y cigarrillos electrnicos. Si necesita ayuda para dejar de fumar, consulte al mdico.  Trabaje con un asesor o instructor en diabetes para identificar estrategias para controlar el estrs y cualquier desafo emocional y social. Preguntas para hacerle al mdico  Es necesario que consulte a un instructor en el cuidado de la diabetes?  Es necesario que me rena con un nutricionista?  A qu nmero puedo llamar si tengo preguntas?  Cules son los mejores momentos para controlar la glucemia? Dnde encontrar ms informacin:  Asociacin Estadounidense de la Diabetes (American Diabetes Association): diabetes.org  Academia de Nutricin y Diettica (Academy of Nutrition and Dietetics): www.eatright.org  Instituto Nacional de la Diabetes y las Enfermedades Digestivas y Renales (National Institute of Diabetes and Digestive and Kidney Diseases, NIH): www.niddk.nih.gov Resumen  Un plan de alimentacin saludable lo ayudar a controlar la glucemia y mantener un estilo de vida saludable.    Trabajar con un especialista en dietas y nutricin (nutricionista) puede ayudarlo a elaborar el mejor plan de alimentacin para usted.  Tenga en cuenta que los carbohidratos (hidratos de  carbono) y el alcohol tienen efectos inmediatos en sus niveles de glucemia. Es importante contar los carbohidratos que ingiere y consumir alcohol con prudencia. Esta informacin no tiene como fin reemplazar el consejo del mdico. Asegrese de hacerle al mdico cualquier pregunta que tenga. Document Revised: 12/22/2016 Document Reviewed: 08/03/2016 Elsevier Patient Education  2020 Elsevier Inc.  

## 2019-06-23 LAB — MICROALBUMIN / CREATININE URINE RATIO
Creatinine, Urine: 72.1 mg/dL
Microalb/Creat Ratio: 5 mg/g creat (ref 0–29)
Microalbumin, Urine: 3.3 ug/mL

## 2019-06-27 ENCOUNTER — Telehealth: Payer: Self-pay

## 2019-06-27 NOTE — Telephone Encounter (Signed)
[employer] [facility] [facility]rd [facility]essel[facility]  Id# [id number]  contacted pt to go over urine results pt didn't answer left a detailed vm informing pt of results

## 2019-07-26 MED FILL — TRUE METRIX TEST STRIP: 25 days supply | Qty: 100 | Fill #1

## 2019-08-17 MED FILL — TRUE METRIX TEST STRIP: 25 days supply | Qty: 100 | Fill #2

## 2019-08-17 MED FILL — TRUEplus LANCETS 28G MISC: 25 days supply | Qty: 100 | Fill #1

## 2019-09-28 MED FILL — TRUEplus LANCETS 28G MISC: 25 days supply | Qty: 100 | Fill #2

## 2019-09-28 MED FILL — TRUE METRIX TEST STRIP: 25 days supply | Qty: 100 | Fill #4

## 2019-09-29 MED FILL — TRUE METRIX TEST STRIP: 50 days supply | Qty: 200 | Fill #5

## 2019-10-20 ENCOUNTER — Ambulatory Visit: Payer: Self-pay | Attending: Internal Medicine | Admitting: Internal Medicine

## 2019-10-20 ENCOUNTER — Other Ambulatory Visit: Payer: Self-pay

## 2019-10-20 DIAGNOSIS — Z91199 Patient's noncompliance with other medical treatment and regimen due to unspecified reason: Secondary | ICD-10-CM

## 2019-10-20 DIAGNOSIS — Z5329 Procedure and treatment not carried out because of patient's decision for other reasons: Secondary | ICD-10-CM

## 2019-10-20 NOTE — Progress Notes (Signed)
Unable to reach patient on her cell phone or home phone.  Left message for her to call to reschedule

## 2019-12-11 MED FILL — TRUE METRIX TEST STRIP: 125 days supply | Qty: 500 | Fill #6

## 2020-01-04 ENCOUNTER — Ambulatory Visit: Payer: Self-pay | Attending: Family | Admitting: Internal Medicine

## 2020-01-04 ENCOUNTER — Ambulatory Visit: Payer: Self-pay | Admitting: Family

## 2020-01-04 DIAGNOSIS — R0789 Other chest pain: Secondary | ICD-10-CM

## 2020-01-04 DIAGNOSIS — Z23 Encounter for immunization: Secondary | ICD-10-CM

## 2020-01-04 DIAGNOSIS — Z7189 Other specified counseling: Secondary | ICD-10-CM

## 2020-01-04 DIAGNOSIS — E119 Type 2 diabetes mellitus without complications: Secondary | ICD-10-CM

## 2020-01-04 MED ORDER — GABAPENTIN 300 MG PO CAPS: 300.0000 mg | ORAL_CAPSULE | Freq: Every day | ORAL | 3 refills | Status: AC

## 2020-01-04 MED FILL — GABAPENTIN 300 MG CAPSULE: 300 | 30 days supply | Qty: 30 | Fill #0

## 2020-01-04 NOTE — Progress Notes (Signed)
Virtual Visit via Telephone Note Due to current restrictions/limitations of in-office visits due to the COVID-19 pandemic, this scheduled clinical appointment was converted to a telehealth visit  I connected with Tanya Cruz on 01/04/20 at 12:24 p.m by telephone and verified that I am speaking with the correct person using two identifiers. I am in my office.  The patient is at home.  Only the patient, myself and Antonio from [employer] [phone]) participated in this encounter.  I discussed the limitations, risks, security and privacy concerns of performing an evaluation and management service by telephone and the availability of in person appointments. I also discussed with the patient that there may be a patient responsible charge related to this service. The patient expressed understanding and agreed to proceed.   History of Present Illness: DM, obesity, ??Sjogrens, TB latent (Did not complete Rifampin due to intolerance, Dr. Linus Salmons 05/2018), GERD.  Purpose of today's visit is chronic disease management.  Pt c/o pain around LT axilla and breast x 8 mths There all the time.  Sometimes radiates to head, back, chest, around breast or to the axilla.  Sometimes she feels numbness in her back. Also sometimes she feels it in elbows radiating to hands and in knees going to her feet.  Feels the sensation goes all over her body including her feet and is bothersome to her.  -saw cardiology Dr. Harrell Gave 12/2018 for CP.  Assessed to be low suspicion that symptoms were cardiac in nature.   DM:  BS range 80s.  Checks once a day in the mornings.   Doing well with eating habits. Walks 1-2 times a day for exercise  HM:  Due for flu vaccine.  Agreeable to getting this done.  She has not had COVID vaccine.  States she plans to get it but not sure which one to get.    Outpatient Encounter Medications as of 01/04/2020  Medication Sig Note  . Blood Glucose Monitoring Suppl (TRUE METRIX METER)  w/Device KIT Use as directed   . CALCIUM PO Take by mouth daily.   . cholecalciferol (VITAMIN D3) 25 MCG (1000 UT) tablet Take 1,000 Units by mouth daily. 01/04/2019: sometime  . famotidine (PEPCID) 40 MG tablet Take 1 tablet (40 mg total) by mouth at bedtime. (Patient not taking: Reported on 06/22/2019)   . glucose blood (TRUE METRIX BLOOD GLUCOSE TEST) test strip Use as instructed   . Multiple Vitamin (MULTIVITAMIN) tablet Take 1 tablet by mouth daily.   . Omega-3 Fatty Acids (FISH OIL PO) Take by mouth daily.   Marland Kitchen omeprazole (PRILOSEC) 40 MG capsule Increase Prilosec to 40 mg twice a day for 8 weeks, then reduce to 40 mg per day to control reflux. (Patient not taking: Reported on 06/22/2019)   . Plant Sterol Stanol-Pantethine (CHOLESTOFF COMPLETE PO) Take by mouth as directed. Pt states that she takes 1-3 daily   . TRUEplus Lancets 28G MISC Use as directed    No facility-administered encounter medications on file as of 01/04/2020.     Observations/Objective: No direct observation done as this was a telephone encounter.  Assessment and Plan: 1. Atypical chest pain -Seems very atypical in nature.  I wonder whether she has a component of neuropathic pain whether this is anxiety related.  I recommend trial of gabapentin at night.  Prescription sent to the pharmacy.  Follow-up with [place] in several weeks to reassess  2. Type 2 diabetes mellitus without complication, without long-term current use of insulin (HCC) Blood sugars under good  control.  Continue healthy eating habits and regular exercise - CBC; Future - Comprehensive metabolic panel; Future - Lipid panel; Future - Hemoglobin A1c; Future  3. Need for influenza vaccination I will have my CMA schedule appointment for her to come in and have the flu shot.  4. Educated about COVID-19 virus infection Strongly advised her not to delay getting the COVID-19 vaccine.  Informed her that the vaccine is safe and effective and she can get either the  [place] or the [place] vaccine   Follow Up Instructions: 4-6 wks   I discussed the assessment and treatment plan with the patient. The patient was provided an opportunity to ask questions and all were answered. The patient agreed with the plan and demonstrated an understanding of the instructions.   The patient was advised to call back or seek an in-person evaluation if the symptoms worsen or if the condition fails to improve as anticipated.  I provided 17 minutes of non-face-to-face time during this encounter.   Karle Plumber, MD

## 2020-01-18 ENCOUNTER — Other Ambulatory Visit: Payer: Self-pay

## 2020-01-18 ENCOUNTER — Ambulatory Visit: Payer: Self-pay | Attending: Internal Medicine | Admitting: Pharmacist

## 2020-01-18 DIAGNOSIS — Z23 Encounter for immunization: Secondary | ICD-10-CM

## 2020-01-18 DIAGNOSIS — E119 Type 2 diabetes mellitus without complications: Secondary | ICD-10-CM

## 2020-01-18 NOTE — Progress Notes (Signed)
Patient presents for vaccination against influenza per orders of Dr. Johnson. Consent given. Counseling provided. No contraindications exists. Vaccine administered without incident.  ° °Luke Van Ausdall, PharmD, CPP °Clinical Pharmacist °[facility] °[phone] ° °

## 2020-01-19 LAB — LIPID PANEL
Chol/HDL Ratio: 3.6 ratio (ref 0.0–4.4)
Cholesterol, Total: 178 mg/dL (ref 100–199)
HDL: 49 mg/dL (ref >39)
LDL Chol Calc (NIH): 105 mg/dL — ABNORMAL HIGH (ref 0–99)
Triglycerides: 136 mg/dL (ref 0–149)
VLDL Cholesterol Cal: 24 mg/dL (ref 5–40)

## 2020-01-19 LAB — COMPREHENSIVE METABOLIC PANEL
ALT: 20 IU/L (ref 0–32)
AST: 20 IU/L (ref 0–40)
Albumin/Globulin Ratio: 1.5 (ref 1.2–2.2)
Albumin: 4.5 g/dL (ref 3.8–4.8)
Alkaline Phosphatase: 73 IU/L (ref 44–121)
BUN/Creatinine Ratio: 36 — ABNORMAL HIGH (ref 9–23)
BUN: 21 mg/dL (ref 6–24)
Bilirubin Total: 0.4 mg/dL (ref 0.0–1.2)
CO2: 22 mmol/L (ref 20–29)
Calcium: 8.8 mg/dL (ref 8.7–10.2)
Chloride: 104 mmol/L (ref 96–106)
Creatinine, Ser: 0.58 mg/dL (ref 0.57–1.00)
GFR calc Af Amer: 128 mL/min/{1.73_m2} (ref >59)
GFR calc non Af Amer: 111 mL/min/{1.73_m2} (ref >59)
Globulin, Total: 3 g/dL (ref 1.5–4.5)
Glucose: 101 mg/dL — ABNORMAL HIGH (ref 65–99)
Potassium: 4.5 mmol/L (ref 3.5–5.2)
Sodium: 140 mmol/L (ref 134–144)
Total Protein: 7.5 g/dL (ref 6.0–8.5)

## 2020-01-19 LAB — CBC
Hematocrit: 38.1 % (ref 34.0–46.6)
Hemoglobin: 11.7 g/dL (ref 11.1–15.9)
MCH: 25.9 pg — ABNORMAL LOW (ref 26.6–33.0)
MCHC: 30.7 g/dL — ABNORMAL LOW (ref 31.5–35.7)
MCV: 85 fL (ref 79–97)
Platelets: 192 10*3/uL (ref 150–450)
RBC: 4.51 x10E6/uL (ref 3.77–5.28)
RDW: 14.9 % (ref 11.7–15.4)
WBC: 5 10*3/uL (ref 3.4–10.8)

## 2020-01-19 LAB — HEMOGLOBIN A1C
Est. average glucose Bld gHb Est-mCnc: 117 mg/dL
Hgb A1c MFr Bld: 5.7 % — ABNORMAL HIGH (ref 4.8–5.6)

## 2020-02-16 ENCOUNTER — Ambulatory Visit: Payer: Self-pay | Admitting: Internal Medicine

## 2020-07-12 ENCOUNTER — Ambulatory Visit: Payer: Self-pay

## 2020-07-12 NOTE — Telephone Encounter (Signed)
FYI

## 2020-07-12 NOTE — Telephone Encounter (Signed)
Patient's daughter Brita Romp called and patient gave verbal consent to speak to her. Patient says she has a boil to her vaginal area that has been there since February that is now draining bloody drainage. She says initially in February it was draining pus, then a pink tinged drainage, but now dark red blood. She denies spreading of a rash. She says she's not able to look at it but it feels the size of a nickle. She denies pain to the area. She says she's been squeezing the boil. No fever. I called the office and spoke to [place], [facility], asked about the mobile unit. Alcario Drought says the mobile unit is not running until Monday and advised UC. I advised the daughter to go to the UC or do a virtual visit found on [employer]. Patient's daughter verbalized understanding.  Reason for Disposition . [1] Boil AND [2] diabetes mellitus or weak immune system (e.g., HIV positive, cancer chemo, splenectomy, organ transplant, chronic steroids)  Answer Assessment - Initial Assessment Questions 1. APPEARANCE of BOIL: "What does the boil look like?"      Not able to look at it 2. LOCATION: "Where is the boil located?"      In her private area 3. NUMBER: "How many boils are there?"      1 4. SIZE: "How big is the boil?" (e.g., inches, cm; compare to size of a coin or other object)     Size of a nickle 5. ONSET: "When did the boil start?"     February 6. PAIN: "Is there any pain?" If Yes, ask: "How bad is the pain?"   (Scale 1-10; or mild, moderate, severe)     No pain at this time 7. FEVER: "Do you have a fever?" If Yes, ask: "What is it, how was it measured, and when did it start?"      No 8. SOURCE: "Have you been around anyone with boils or other Staph infections?" "Have you ever had boils before?"     Not around anyone, had a boil 2 years 9. OTHER SYMPTOMS: "Do you have any other symptoms?" (e.g., shaking chills, weakness, rash elsewhere on body)     No 10. PREGNANCY: "Is there any chance you are pregnant?" "When was  your last menstrual period?"     No  Protocols used: BOIL (SKIN ABSCESS)-A-AH

## 2020-07-31 ENCOUNTER — Other Ambulatory Visit: Payer: Self-pay

## 2020-08-02 ENCOUNTER — Other Ambulatory Visit: Payer: Self-pay | Admitting: Pharmacy Technician

## 2020-08-03 ENCOUNTER — Other Ambulatory Visit: Payer: Self-pay | Admitting: Internal Medicine

## 2020-08-03 DIAGNOSIS — E119 Type 2 diabetes mellitus without complications: Secondary | ICD-10-CM

## 2020-08-03 MED ORDER — TRUE METRIX BLOOD GLUCOSE TEST VI STRP
ORAL_STRIP | 12 refills | Status: AC
  Filled 2020-08-03: qty 100, 25d supply, fill #0

## 2020-08-05 ENCOUNTER — Other Ambulatory Visit: Payer: Self-pay

## 2020-08-06 ENCOUNTER — Other Ambulatory Visit: Payer: Self-pay

## 2020-08-12 ENCOUNTER — Other Ambulatory Visit: Payer: Self-pay

## 2020-12-29 ENCOUNTER — Ambulatory Visit (HOSPITAL_COMMUNITY)
Admission: EM | Admit: 2020-12-29 | Discharge: 2020-12-29 | Disposition: A | Discharge status: Home / Self Care | Payer: Self-pay | Attending: Physician Assistant | Admitting: Physician Assistant

## 2020-12-29 ENCOUNTER — Encounter (HOSPITAL_COMMUNITY): Payer: Self-pay | Admitting: *Deleted

## 2020-12-29 DIAGNOSIS — Z20822 Contact with and (suspected) exposure to covid-19: Secondary | ICD-10-CM | POA: Insufficient documentation

## 2020-12-29 DIAGNOSIS — J069 Acute upper respiratory infection, unspecified: Secondary | ICD-10-CM | POA: Insufficient documentation

## 2020-12-29 HISTORY — DX: Hyperglycemia, unspecified: R73.9

## 2020-12-29 LAB — POCT RAPID STREP A, ED / UC: Streptococcus, Group A Screen (Direct): NEGATIVE

## 2020-12-29 NOTE — Discharge Instructions (Addendum)
Will call if Covid positive. Plan follow up with any worsening symptoms or failure to improve. Ok to treat with OTC symptomatic treatment options, encourage rest and increased fluids.

## 2020-12-29 NOTE — ED Provider Notes (Signed)
[name]    CSN: [id number] Arrival date & time: 12/29/20  1305      History   Chief Complaint Chief Complaint  Patient presents with   Fever   Sore Throat    HPI Tanya Cruz is a 45 y.o. female.   Patient here today with husband for evaluation of fever, body aches, and congestion that started today.  States that she has had some sore throat as well.  Has tried Tylenol with mild relief of symptoms.  Denies any ear pain.  Has had some cough.  No abdominal pain, nausea, vomiting.  The history is provided by the patient and the spouse. Language interpreter used: husband.   Past Medical History:  Diagnosis Date   GERD (gastroesophageal reflux disease)    High cholesterol    Hyperglycemia    Positive ANA (antinuclear antibody)    Tuberculosis    Patient states she went for a second opinion and was told tshe did not have TB    Patient Active Problem List   Diagnosis Date Noted   Diabetes mellitus, type II ([place]) 01/16/2019   Tuberculosis 01/16/2019   Sjogren's disease ([place]) 01/16/2019   History of gastroesophageal reflux (GERD) 05/06/2018    Past Surgical History:  Procedure Laterality Date   CESAREAN SECTION     x2   CESAREAN SECTION  04/04/2012   Procedure: CESAREAN SECTION;  Surgeon: Osborne Oman, MD;  Location: [facility];  Service: Obstetrics;  Laterality: N/A;   FOOT FRACTURE SURGERY      OB History     Gravida  4   Para  4   Term  1   Preterm      AB      Living  3      SAB      IAB      Ectopic      Multiple      Live Births               Home Medications    Prior to Admission medications   Medication Sig Start Date End Date Taking? Authorizing Provider  cholecalciferol (VITAMIN D3) 25 MCG (1000 UT) tablet Take 1,000 Units by mouth daily.   Yes [provider]  omeprazole (PRILOSEC) 40 MG capsule Increase Prilosec to 40 mg twice a day for 8 weeks, then reduce to 40 mg per day to control reflux.  01/25/19  Yes Cirigliano, Vito V, DO  Blood Glucose Monitoring Suppl (TRUE METRIX METER) w/Device KIT Use as directed 06/22/19   Ladell Pier, MD  CALCIUM PO Take by mouth daily.    [provider]  gabapentin (NEURONTIN) 300 MG capsule Take 1 capsule (300 mg total) by mouth at bedtime. 01/04/20   Ladell Pier, MD  glucose blood (TRUE METRIX BLOOD GLUCOSE TEST) test strip Use as instructed 08/03/20   Ladell Pier, MD  Multiple Vitamin (MULTIVITAMIN) tablet Take 1 tablet by mouth daily.    [provider]  Omega-3 Fatty Acids (FISH OIL PO) Take by mouth daily.    [provider]  Plant Sterol Stanol-Pantethine (CHOLESTOFF COMPLETE PO) Take by mouth as directed. Pt states that she takes 1-3 daily    [provider]  TRUEplus Lancets 28G MISC Use as directed 06/22/19   Ladell Pier, MD    Family History Family History  Problem Relation Age of Onset   Hyperlipidemia Mother    Diabetes Father    Diabetes Paternal Grandmother  Healthy Son    Healthy Son    Healthy Son    Healthy Daughter    Colon cancer Neg Hx    Esophageal cancer Neg Hx    Stomach cancer Neg Hx     Social History Social History   Tobacco Use   Smoking status: Former   Smokeless tobacco: Never  Scientific laboratory technician Use: Never used  Substance Use Topics   Alcohol use: Not Currently   Drug use: Never     Allergies   Patient has no known allergies.   Review of Systems Review of Systems  Constitutional:  Positive for fever. Negative for chills.  HENT:  Positive for congestion, sinus pressure and sore throat. Negative for ear pain.   Eyes:  Negative for discharge and redness.  Respiratory:  Positive for cough. Negative for shortness of breath and wheezing.   Gastrointestinal:  Negative for abdominal pain, diarrhea, nausea and vomiting.    Physical Exam Triage Vital Signs ED Triage Vitals  Enc Vitals Group     BP 12/29/20 1500 108/75     Pulse Rate  12/29/20 1500 92     Resp 12/29/20 1500 18     Temp 12/29/20 1500 99.3 F (37.4 C)     Temp Source 12/29/20 1500 Oral     SpO2 12/29/20 1500 97 %     Weight --      Height --      Head Circumference --      Peak Flow --      Pain Score 12/29/20 1501 6     Pain Loc --      Pain Edu? --      Excl. in [place]? --    No data found.  Updated Vital Signs BP 108/75   Pulse 92   Temp 99.3 F (37.4 C) (Oral)   Resp 18   LMP 12/15/2020 (Approximate)   SpO2 97%      Physical Exam Vitals and nursing note reviewed.  Constitutional:      General: She is not in acute distress.    Appearance: Normal appearance. She is not ill-appearing.  HENT:     Head: Normocephalic and atraumatic.     Right Ear: Tympanic membrane normal.     Left Ear: Tympanic membrane normal.     Nose: Congestion present.     Mouth/Throat:     Mouth: Mucous membranes are moist.     Pharynx: No oropharyngeal exudate or posterior oropharyngeal erythema.  Eyes:     Conjunctiva/sclera: Conjunctivae normal.  Cardiovascular:     Rate and Rhythm: Normal rate and regular rhythm.     Heart sounds: Normal heart sounds. No murmur heard. Pulmonary:     Effort: Pulmonary effort is normal. No respiratory distress.     Breath sounds: Normal breath sounds. No wheezing, rhonchi or rales.  Skin:    General: Skin is warm and dry.  Neurological:     Mental Status: She is alert.  Psychiatric:        Mood and Affect: Mood normal.        Thought Content: Thought content normal.     UC Treatments / Results  Labs (all labs ordered are listed, but only abnormal results are displayed) Labs Reviewed  SARS CORONAVIRUS 2 (TAT 6-24 HRS)  CULTURE, GROUP A STREP [facility])  POCT RAPID STREP A, ED / UC    EKG   Radiology No results found.  Procedures Procedures (including critical care time)  Medications Ordered in UC Medications - No data to display  Initial Impression / Assessment and Plan / UC Course  I have reviewed the  triage vital signs and the nursing notes.  Pertinent labs & imaging results that were available during my care of the patient were reviewed by me and considered in my medical decision making (see chart for details).  Strep screening negative.  Strep culture ordered.  Will await COVID testing.  Discussed suspect viral etiology.  Recommended symptomatic treatment, rest, increase fluids.  Encouraged follow-up with any further concerns or worsening symptoms.  Final Clinical Impressions(s) / UC Diagnoses   Final diagnoses:  Encounter for laboratory testing for COVID-19 virus  Viral upper respiratory tract infection     Discharge Instructions      Will call if Covid positive. Plan follow up with any worsening symptoms or failure to improve. Ok to treat with OTC symptomatic treatment options, encourage rest and increased fluids.      ED Prescriptions   None    PDMP not reviewed this encounter.   Francene Finders, PA-C 12/29/20 1626

## 2020-12-29 NOTE — ED Triage Notes (Signed)
Pt reports waking up this AM with tactile fever, sore throat, body aches, spitting out bloody sputum from throat.  Took Tyl - last dose @ 1200.

## 2020-12-30 LAB — CULTURE, GROUP A STREP (THRC)

## 2020-12-30 LAB — SARS CORONAVIRUS 2 (TAT 6-24 HRS): SARS Coronavirus 2: NEGATIVE

## 2020-12-31 ENCOUNTER — Telehealth (HOSPITAL_COMMUNITY): Payer: Self-pay | Admitting: Emergency Medicine

## 2020-12-31 MED ORDER — PENICILLIN V POTASSIUM 500 MG PO TABS: 500.0000 mg | ORAL_TABLET | Freq: Two times a day (BID) | ORAL | 0 refills | Status: AC

## 2022-07-07 NOTE — Telephone Encounter (Signed)
Unable to reach patient.
